# Patient Record
Sex: Female | Born: 1970 | Race: Black or African American | Hispanic: No | Marital: Married | Smoking: Never smoker
Health system: Southern US, Community
[De-identification: ages and names within clinical notes are randomized; demographics above are authoritative.]

## PROBLEM LIST (undated history)

## (undated) ENCOUNTER — Ambulatory Visit (HOSPITAL_COMMUNITY): Discharge status: Absent without leave | Payer: BC Managed Care – PPO

## (undated) DIAGNOSIS — D219 Benign neoplasm of connective and other soft tissue, unspecified: Secondary | ICD-10-CM

## (undated) DIAGNOSIS — N9089 Other specified noninflammatory disorders of vulva and perineum: Secondary | ICD-10-CM

## (undated) HISTORY — PX: OTHER SURGICAL HISTORY: SHX169

---
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
## 1999-08-28 ENCOUNTER — Encounter: Payer: Self-pay | Admitting: *Deleted

## 1999-08-28 ENCOUNTER — Encounter: Admission: RE | Admit: 1999-08-28 | Discharge: 1999-08-28 | Payer: Self-pay | Admitting: *Deleted

## 2001-06-09 ENCOUNTER — Encounter: Payer: Self-pay | Admitting: Surgery

## 2001-06-09 ENCOUNTER — Encounter: Admission: RE | Admit: 2001-06-09 | Discharge: 2001-06-09 | Payer: Self-pay | Admitting: Surgery

## 2007-11-06 DIAGNOSIS — D219 Benign neoplasm of connective and other soft tissue, unspecified: Secondary | ICD-10-CM

## 2007-11-06 HISTORY — DX: Benign neoplasm of connective and other soft tissue, unspecified: D21.9

## 2010-11-26 ENCOUNTER — Encounter: Payer: Self-pay | Admitting: Obstetrics & Gynecology

## 2013-01-02 ENCOUNTER — Other Ambulatory Visit: Payer: Self-pay | Admitting: Obstetrics & Gynecology

## 2013-01-02 DIAGNOSIS — R928 Other abnormal and inconclusive findings on diagnostic imaging of breast: Secondary | ICD-10-CM

## 2013-01-05 ENCOUNTER — Other Ambulatory Visit: Payer: Self-pay | Admitting: Obstetrics & Gynecology

## 2013-01-05 DIAGNOSIS — D259 Leiomyoma of uterus, unspecified: Secondary | ICD-10-CM

## 2013-01-08 ENCOUNTER — Other Ambulatory Visit: Payer: Self-pay | Admitting: Obstetrics & Gynecology

## 2013-01-08 ENCOUNTER — Ambulatory Visit
Admission: RE | Admit: 2013-01-08 | Discharge: 2013-01-08 | Disposition: A | Discharge status: Home / Self Care | Payer: Self-pay | Source: Ambulatory Visit | Attending: Obstetrics & Gynecology | Admitting: Obstetrics & Gynecology

## 2013-01-08 DIAGNOSIS — D259 Leiomyoma of uterus, unspecified: Secondary | ICD-10-CM

## 2013-01-08 HISTORY — DX: Benign neoplasm of connective and other soft tissue, unspecified: D21.9

## 2013-01-14 ENCOUNTER — Ambulatory Visit
Admission: RE | Admit: 2013-01-14 | Discharge: 2013-01-14 | Disposition: A | Discharge status: Home / Self Care | Payer: BC Managed Care – PPO | Source: Ambulatory Visit | Attending: Obstetrics & Gynecology | Admitting: Obstetrics & Gynecology

## 2013-01-14 DIAGNOSIS — R928 Other abnormal and inconclusive findings on diagnostic imaging of breast: Secondary | ICD-10-CM

## 2013-01-17 ENCOUNTER — Ambulatory Visit
Admission: RE | Admit: 2013-01-17 | Discharge: 2013-01-17 | Disposition: A | Discharge status: Home / Self Care | Payer: BC Managed Care – PPO | Source: Ambulatory Visit | Attending: Obstetrics & Gynecology | Admitting: Obstetrics & Gynecology

## 2013-01-17 DIAGNOSIS — D259 Leiomyoma of uterus, unspecified: Secondary | ICD-10-CM

## 2013-01-17 MED ORDER — GADOBENATE DIMEGLUMINE 529 MG/ML IV SOLN
19.0000 mL | Freq: Once | INTRAVENOUS | Status: AC | PRN
  Administered 2013-01-17: 19 mL via INTRAVENOUS

## 2013-01-20 ENCOUNTER — Telehealth: Payer: Self-pay | Admitting: Emergency Medicine

## 2013-01-20 NOTE — Telephone Encounter (Signed)
LM FOR PT TO LET HER KNOW THAT DR HOSS REVIEWED THE MRI AND WAS GOOD FOR THE [place] PROCEDURE.

## 2013-01-21 ENCOUNTER — Telehealth: Payer: Self-pay | Admitting: Emergency Medicine

## 2013-01-21 NOTE — Telephone Encounter (Signed)
PT CALLED AND WANTS SPECIFIC INFO ABOUT MRI RESULTS, ALSO HAS ADDITIONAL QUESTIONS ABOUT PROCEDURE AND INFO IN THE PAMPHLET.  WILL FORWARD MESSAGE TO DR HOSS AND HAVE HIM TO CONTACT PT, DIRECTLY.  Jari Sportsman, EMT 01/21/2013 3:45 PM

## 2013-01-22 NOTE — Telephone Encounter (Signed)
PT HAD ADDITIONAL QUESTIONS AND WANTED TO KNOW HER SPECIFIC RESULTS OF MRI.    TOLD DR HOSS AND HE WILL CONTACT PT.

## 2013-01-26 ENCOUNTER — Other Ambulatory Visit (HOSPITAL_COMMUNITY): Payer: Self-pay | Admitting: Interventional Radiology

## 2013-01-26 DIAGNOSIS — D219 Benign neoplasm of connective and other soft tissue, unspecified: Secondary | ICD-10-CM

## 2013-02-03 ENCOUNTER — Telehealth: Payer: Self-pay | Admitting: Emergency Medicine

## 2013-02-03 NOTE — Telephone Encounter (Signed)
LMOVM TO MAKE AWARE THAT NO AUTHO IS NEEDED FOR [place] PROCEDURE.

## 2013-02-13 ENCOUNTER — Encounter (HOSPITAL_COMMUNITY): Payer: Self-pay | Admitting: Pharmacy Technician

## 2013-02-19 ENCOUNTER — Other Ambulatory Visit: Payer: Self-pay | Admitting: Radiology

## 2013-02-23 ENCOUNTER — Observation Stay (HOSPITAL_COMMUNITY)
Admission: RE | Admit: 2013-02-23 | Discharge: 2013-02-24 | Disposition: A | Discharge status: Home / Self Care | Payer: BC Managed Care – PPO | Source: Ambulatory Visit | Attending: Interventional Radiology | Admitting: Interventional Radiology

## 2013-02-23 ENCOUNTER — Encounter (HOSPITAL_COMMUNITY): Payer: Self-pay | Admitting: Interventional Radiology

## 2013-02-23 ENCOUNTER — Encounter (HOSPITAL_COMMUNITY): Payer: Self-pay

## 2013-02-23 ENCOUNTER — Ambulatory Visit (HOSPITAL_COMMUNITY)
Admission: RE | Admit: 2013-02-23 | Discharge: 2013-02-23 | Disposition: A | Discharge status: Home / Self Care | Payer: BC Managed Care – PPO | Source: Ambulatory Visit | Attending: Interventional Radiology | Admitting: Interventional Radiology

## 2013-02-23 VITALS — BP 148/89 | HR 85 | Temp 98.2°F | Resp 14 | Ht 69.0 in | Wt 217.4 lb

## 2013-02-23 DIAGNOSIS — D219 Benign neoplasm of connective and other soft tissue, unspecified: Secondary | ICD-10-CM

## 2013-02-23 DIAGNOSIS — M545 Low back pain, unspecified: Secondary | ICD-10-CM | POA: Insufficient documentation

## 2013-02-23 DIAGNOSIS — N939 Abnormal uterine and vaginal bleeding, unspecified: Secondary | ICD-10-CM | POA: Insufficient documentation

## 2013-02-23 DIAGNOSIS — N920 Excessive and frequent menstruation with regular cycle: Secondary | ICD-10-CM | POA: Insufficient documentation

## 2013-02-23 DIAGNOSIS — R110 Nausea: Secondary | ICD-10-CM | POA: Insufficient documentation

## 2013-02-23 DIAGNOSIS — N926 Irregular menstruation, unspecified: Secondary | ICD-10-CM | POA: Insufficient documentation

## 2013-02-23 DIAGNOSIS — N949 Unspecified condition associated with female genital organs and menstrual cycle: Secondary | ICD-10-CM | POA: Insufficient documentation

## 2013-02-23 DIAGNOSIS — D259 Leiomyoma of uterus, unspecified: Principal | ICD-10-CM | POA: Insufficient documentation

## 2013-02-23 HISTORY — PX: UTERINE FIBROID EMBOLIZATION: SHX825

## 2013-02-23 LAB — HCG, SERUM, QUALITATIVE: Preg, Serum: NEGATIVE

## 2013-02-23 LAB — PROTIME-INR: Prothrombin Time: 12.2 seconds (ref 11.6–15.2)

## 2013-02-23 LAB — BASIC METABOLIC PANEL
BUN: 10 mg/dL (ref 6–23)
Calcium: 8.5 mg/dL (ref 8.4–10.5)
Creatinine, Ser: 0.76 mg/dL (ref 0.50–1.10)
GFR calc Af Amer: >90 mL/min (ref >90)
GFR calc non Af Amer: >90 mL/min (ref >90)
Potassium: 3.6 mEq/L (ref 3.5–5.1)

## 2013-02-23 LAB — CBC
MCHC: 34 g/dL (ref 30.0–36.0)
RDW: 13.3 % (ref 11.5–15.5)

## 2013-02-23 MED ORDER — ONDANSETRON HCL 4 MG/2ML IJ SOLN
4.0000 mg | Freq: Four times a day (QID) | INTRAMUSCULAR | Status: DC | PRN
  Administered 2013-02-23 (×2): 4 mg via INTRAVENOUS
  Filled 2013-02-23: qty 2

## 2013-02-23 MED ORDER — KETOROLAC TROMETHAMINE 30 MG/ML IJ SOLN
30.0000 mg | Freq: Once | INTRAMUSCULAR | Status: AC
  Administered 2013-02-23: 30 mg via INTRAVENOUS
  Filled 2013-02-23: qty 1

## 2013-02-23 MED ORDER — CEFAZOLIN SODIUM-DEXTROSE 2-3 GM-% IV SOLR
2.0000 g | Freq: Once | INTRAVENOUS | Status: AC
  Administered 2013-02-23: 2 g via INTRAVENOUS
  Filled 2013-02-23: qty 50

## 2013-02-23 MED ORDER — DIPHENHYDRAMINE HCL 50 MG/ML IJ SOLN: 12.5000 mg | Freq: Four times a day (QID) | INTRAMUSCULAR | Status: DC | PRN

## 2013-02-23 MED ORDER — SODIUM CHLORIDE 0.9 % IV SOLN
Freq: Once | INTRAVENOUS | Status: AC
  Administered 2013-02-23: 08:00:00 via INTRAVENOUS

## 2013-02-23 MED ORDER — SODIUM CHLORIDE 0.9 % IJ SOLN
3.0000 mL | Freq: Two times a day (BID) | INTRAMUSCULAR | Status: DC
  Administered 2013-02-24: 3 mL via INTRAVENOUS

## 2013-02-23 MED ORDER — MIDAZOLAM HCL 2 MG/2ML IJ SOLN
INTRAMUSCULAR | Status: AC | PRN
  Administered 2013-02-23 (×3): 1 mg via INTRAVENOUS
  Administered 2013-02-23 (×2): 0.5 mg via INTRAVENOUS

## 2013-02-23 MED ORDER — MIDAZOLAM HCL 2 MG/2ML IJ SOLN
INTRAMUSCULAR | Status: AC
  Filled 2013-02-23: qty 6

## 2013-02-23 MED ORDER — PROMETHAZINE HCL 25 MG PO TABS
25.0000 mg | ORAL_TABLET | Freq: Three times a day (TID) | ORAL | Status: DC | PRN
  Filled 2013-02-23: qty 1

## 2013-02-23 MED ORDER — HYDROMORPHONE HCL PF 2 MG/ML IJ SOLN
INTRAMUSCULAR | Status: AC
  Filled 2013-02-23: qty 1

## 2013-02-23 MED ORDER — FENTANYL CITRATE 0.05 MG/ML IJ SOLN: 50.0000 ug | Freq: Once | INTRAMUSCULAR | Status: DC

## 2013-02-23 MED ORDER — FENTANYL CITRATE 0.05 MG/ML IJ SOLN
INTRAMUSCULAR | Status: DC
Start: 2013-02-23 — End: 2013-02-24
  Filled 2013-02-23: qty 6

## 2013-02-23 MED ORDER — ONDANSETRON HCL 4 MG/2ML IJ SOLN
4.0000 mg | Freq: Four times a day (QID) | INTRAMUSCULAR | Status: DC | PRN
  Filled 2013-02-23: qty 2

## 2013-02-23 MED ORDER — LIDOCAINE HCL 1 % IJ SOLN
INTRAMUSCULAR | Status: AC
  Filled 2013-02-23: qty 20

## 2013-02-23 MED ORDER — FENTANYL CITRATE 0.05 MG/ML IJ SOLN
INTRAMUSCULAR | Status: AC | PRN
  Administered 2013-02-23: 100 ug via INTRAVENOUS
  Administered 2013-02-23 (×2): 25 ug via INTRAVENOUS
  Administered 2013-02-23: 50 ug via INTRAVENOUS

## 2013-02-23 MED ORDER — HYDROMORPHONE HCL PF 1 MG/ML IJ SOLN
INTRAMUSCULAR | Status: AC | PRN
  Administered 2013-02-23: 1 mg via INTRAVENOUS

## 2013-02-23 MED ORDER — HYDROMORPHONE 0.3 MG/ML IV SOLN
INTRAVENOUS | Status: DC
  Administered 2013-02-23: 4.2 mg via INTRAVENOUS
  Administered 2013-02-23: 5.7 mg via INTRAVENOUS
  Administered 2013-02-23 – 2013-02-24 (×3): via INTRAVENOUS
  Administered 2013-02-24: 2.7 mg via INTRAVENOUS
  Administered 2013-02-24: 3.3 mg via INTRAVENOUS
  Administered 2013-02-24: 1.2 mg via INTRAVENOUS
  Filled 2013-02-23 (×4): qty 25

## 2013-02-23 MED ORDER — DOCUSATE SODIUM 100 MG PO CAPS
100.0000 mg | ORAL_CAPSULE | Freq: Two times a day (BID) | ORAL | Status: DC
  Administered 2013-02-23 – 2013-02-24 (×2): 100 mg via ORAL
  Filled 2013-02-23 (×4): qty 1

## 2013-02-23 MED ORDER — PROMETHAZINE HCL 25 MG RE SUPP: 25.0000 mg | Freq: Three times a day (TID) | RECTAL | Status: DC | PRN

## 2013-02-23 MED ORDER — IOHEXOL 300 MG/ML  SOLN
90.0000 mL | Freq: Once | INTRAMUSCULAR | Status: AC | PRN
  Administered 2013-02-23: 1 mL via INTRA_ARTERIAL

## 2013-02-23 MED ORDER — SODIUM CHLORIDE 0.9 % IJ SOLN: 9.0000 mL | INTRAMUSCULAR | Status: DC | PRN

## 2013-02-23 MED ORDER — DIPHENHYDRAMINE HCL 12.5 MG/5ML PO ELIX
12.5000 mg | ORAL_SOLUTION | Freq: Four times a day (QID) | ORAL | Status: DC | PRN
  Filled 2013-02-23: qty 5

## 2013-02-23 MED ORDER — NALOXONE HCL 0.4 MG/ML IJ SOLN: 0.4000 mg | INTRAMUSCULAR | Status: DC | PRN

## 2013-02-23 MED ORDER — SODIUM CHLORIDE 0.9 % IV SOLN: 250.0000 mL | INTRAVENOUS | Status: DC | PRN

## 2013-02-23 MED ORDER — SODIUM CHLORIDE 0.9 % IJ SOLN: 3.0000 mL | INTRAMUSCULAR | Status: DC | PRN

## 2013-02-23 MED ORDER — HYDROCODONE-ACETAMINOPHEN 5-325 MG PO TABS
1.0000 | ORAL_TABLET | ORAL | Status: DC | PRN
  Administered 2013-02-24: 2 via ORAL
  Filled 2013-02-23 (×2): qty 2

## 2013-02-23 NOTE — H&P (Signed)
Sharon Bell is an 42 y.o. female.   Chief Complaint: heavy uterine bleeding, pelvic cramping, fibroids HPI: Patient with symptomatic uterine fibroids presents today for bilateral uterine artery embolization.  Past Medical History  Diagnosis Date  . Fibroids 2009    Past Surgical History  Procedure Laterality Date  . Bilateral repair of hammertoe      4th toe of both feet  . Polyknital   1990    polyknital cyst excision     History reviewed. No pertinent family history. Social History:  reports that she has never smoked. She has never used smokeless tobacco. She reports that  drinks alcohol. She reports that she does not use illicit drugs.  Allergies: No Known Allergies  Current outpatient prescriptions:norethindrone-ethinyl estradiol 1/35 (ORTHO-NOVUM 1/35, 28,) tablet, Take 1 tablet by mouth daily., Disp: , Rfl: ;  Multiple Vitamin (MULTIVITAMIN) tablet, Take 1 tablet by mouth daily., Disp: , Rfl: ;  naproxen sodium (ANAPROX) 220 MG tablet, Take 220 mg by mouth 2 (two) times daily with a meal., Disp: , Rfl:  Current facility-administered medications:ceFAZolin (ANCEF) IVPB 2 g/50 mL premix, 2 g, Intravenous, Once, Robet Leu, PA-C  Results for orders placed during the hospital encounter of 02/23/13  CBC      Result Value Range   WBC 6.4  4.0 - 10.5 K/uL   RBC 4.16  3.87 - 5.11 MIL/uL   Hemoglobin 12.4  12.0 - 15.0 g/dL   HCT 45.4  09.8 - 11.9 %   MCV 87.7  78.0 - 100.0 fL   MCH 29.8  26.0 - 34.0 pg   MCHC 34.0  30.0 - 36.0 g/dL   RDW 14.7  82.9 - 56.2 %   Platelets 318  150 - 400 K/uL   BMP/PT/INR PENDING Review of Systems  Constitutional: Negative for fever and chills.  Respiratory: Negative for cough and shortness of breath.   Cardiovascular: Negative for chest pain.  Gastrointestinal: Positive for abdominal pain. Negative for nausea and vomiting.       Occ constipation  Genitourinary: Negative for dysuria and hematuria.       Heavy uterine  bleeding, pelvic cramping  Musculoskeletal:       Occ low back pain  Neurological: Negative for headaches.   Vitals: BP 129/87  HR 87  R 18  O2 SATS 99%RA  TEMP 97.4 Last menstrual period 02/10/2013. Physical Exam  Constitutional: She is oriented to person, place, and time. She appears well-developed and well-nourished.  Cardiovascular: Normal rate and regular rhythm.   Respiratory: Effort normal and breath sounds normal.  GI: Soft. Bowel sounds are normal. There is tenderness.  Fibroid uterus  Musculoskeletal: Normal range of motion. She exhibits no edema.  Neurological: She is alert and oriented to person, place, and time.     Assessment/Plan Patient with symptomatic uterine fibroids. Plan is for bilateral uterine artery embolization today. Details/risks of procedure d/w pt/husband with their understanding and consent. Pt will be observed overnight following procedure for pain control.  Chivas Notz,D KEVIN 02/23/2013, 8:31 AM

## 2013-02-23 NOTE — Progress Notes (Signed)
Subjective: Pt c/o intermittent nausea, pelvic cramping  Objective: Vital signs in last 24 hours: Temp:  [97.4 F (36.3 C)-98.1 F (36.7 C)] 97.5 F (36.4 C) (04/21 1511) Pulse Rate:  [66-87] 68 (04/21 1511) Resp:  [9-22] 12 (04/21 1511) BP: (111-171)/(10-104) 157/91 mmHg (04/21 1511) SpO2:  [95 %-100 %] 99 % (04/21 1511) Weight:  [201 lb (91.173 kg)-217 lb 6 oz (98.6 kg)] 217 lb 6 oz (98.6 kg) (04/21 1140) Last BM Date: 02/23/13  Intake/Output from previous day:   Intake/Output this shift: Total I/O In: 282 [I.V.:282] Out: 575 [Urine:575]  Rt groin puncture site clean and dry, NT, no hematoma; distal pulses intact;  abd- soft,+BS, mild- mod tender pelvic region, fibroid uterus  Lab Results:   Recent Labs  02/23/13 0815  WBC 6.4  HGB 12.4  HCT 36.5  PLT 318   BMET  Recent Labs  02/23/13 0815  NA 137  K 3.6  CL 106  CO2 25  GLUCOSE 110*  BUN 10  CREATININE 0.76  CALCIUM 8.5   PT/INR  Recent Labs  02/23/13 0815  LABPROT 12.2  INR 0.91   ABG No results found for this basename: PHART, PCO2, PO2, HCO3,  in the last 72 hours  Studies/Results: Ir Angiogram Pelvis Selective Or Supraselective  02/23/2013  *RADIOLOGY REPORT*  Clinical Data/Indication: MENORRHAGIA.  PELVIC PAIN.  UTERINE FIBROIDS.  UTERINE ARTERY EMBOLIZATION  Sedation: Versed 4.0mg , Fentanyl 200 mg.  Total Moderate Sedation Time: 105 minutes.  Contrast Volume: 130 ml Omnipaque-300.  Additional Medications: 30 mg Toradol.  As antibiotic prophylaxis, Ancef  was ordered pre-procedure and administered intravenously within one hour of incision.  Fluoroscopy Time: 24.4 minutes.  Vessels selected:  Internal iliac artery bilateral third order.  Procedure: The procedure, risks, benefits, and alternatives were explained to the patient. Questions regarding the procedure were encouraged and answered. The patient understands and consents to the procedure.  The right groin was prepped with betadine in a  sterile fashion, and a sterile drape was applied covering the operative field. A sterile gown and sterile gloves were used for the procedure.  Under sonographic guidance, a micropuncture needle was inserted into the right common femoral artery and upsized to a [place].  A 5- French sheath was inserted.  Cobra II catheter was advanced into the aorta.  The contralateral left common iliac artery was selected.  The internal iliac artery on the left was selected. 3-D angiography was performed.  A micro catheter was advanced over an 018 glide wire into the left uterine artery.  Angiography was performed.  Embolization was performed utilizing two vials 500-700 micron embospheres particles and four vials 700-900 micron embospheres.  The micro catheter was removed and flushed.  A Waltman's loop was created.  The ipsilateral right common iliac artery and right internal iliac artery were selected. 3-D angiography was performed. A micro catheter was advanced over a 0018 glide wire into the right uterine artery.  Angiography was performed.  Embolization was again performed with two vials 500-700 micron embospheres and one bile 700-900 micron embospheres.  The micro catheter and Cobra catheter were removed.  Right femoral angiography was performed. An Exoseal device was deployed without complication and hemostasis was achieved.  Findings: Imaging demonstrates bilateral pelvic anatomy and bilateral uterine artery angiography.  Expected anatomy is identified.  Post embolization angiography demonstrates relative stasis of flow and pruning of the vasculature to the uterus.  Complications: None.  IMPRESSION: Successful bilateral uterine artery embolization.   Original Report Authenticated By:  Jolaine Click, M.D.    Ir Angiogram Pelvis Selective Or Supraselective  02/23/2013  *RADIOLOGY REPORT*  Clinical Data/Indication: MENORRHAGIA.  PELVIC PAIN.  UTERINE FIBROIDS.  UTERINE ARTERY EMBOLIZATION  Sedation: Versed 4.0mg , Fentanyl 200 mg.   Total Moderate Sedation Time: 105 minutes.  Contrast Volume: 130 ml Omnipaque-300.  Additional Medications: 30 mg Toradol.  As antibiotic prophylaxis, Ancef  was ordered pre-procedure and administered intravenously within one hour of incision.  Fluoroscopy Time: 24.4 minutes.  Vessels selected:  Internal iliac artery bilateral third order.  Procedure: The procedure, risks, benefits, and alternatives were explained to the patient. Questions regarding the procedure were encouraged and answered. The patient understands and consents to the procedure.  The right groin was prepped with betadine in a sterile fashion, and a sterile drape was applied covering the operative field. A sterile gown and sterile gloves were used for the procedure.  Under sonographic guidance, a micropuncture needle was inserted into the right common femoral artery and upsized to a [place].  A 5- French sheath was inserted.  Cobra II catheter was advanced into the aorta.  The contralateral left common iliac artery was selected.  The internal iliac artery on the left was selected. 3-D angiography was performed.  A micro catheter was advanced over an 018 glide wire into the left uterine artery.  Angiography was performed.  Embolization was performed utilizing two vials 500-700 micron embospheres particles and four vials 700-900 micron embospheres.  The micro catheter was removed and flushed.  A Waltman's loop was created.  The ipsilateral right common iliac artery and right internal iliac artery were selected. 3-D angiography was performed. A micro catheter was advanced over a 0018 glide wire into the right uterine artery.  Angiography was performed.  Embolization was again performed with two vials 500-700 micron embospheres and one bile 700-900 micron embospheres.  The micro catheter and Cobra catheter were removed.  Right femoral angiography was performed. An Exoseal device was deployed without complication and hemostasis was achieved.  Findings:  Imaging demonstrates bilateral pelvic anatomy and bilateral uterine artery angiography.  Expected anatomy is identified.  Post embolization angiography demonstrates relative stasis of flow and pruning of the vasculature to the uterus.  Complications: None.  IMPRESSION: Successful bilateral uterine artery embolization.   Original Report Authenticated By: Jolaine Click, M.D.    Ir Angiogram Selective Each Additional Vessel  02/23/2013  *RADIOLOGY REPORT*  Clinical Data/Indication: MENORRHAGIA.  PELVIC PAIN.  UTERINE FIBROIDS.  UTERINE ARTERY EMBOLIZATION  Sedation: Versed 4.0mg , Fentanyl 200 mg.  Total Moderate Sedation Time: 105 minutes.  Contrast Volume: 130 ml Omnipaque-300.  Additional Medications: 30 mg Toradol.  As antibiotic prophylaxis, Ancef  was ordered pre-procedure and administered intravenously within one hour of incision.  Fluoroscopy Time: 24.4 minutes.  Vessels selected:  Internal iliac artery bilateral third order.  Procedure: The procedure, risks, benefits, and alternatives were explained to the patient. Questions regarding the procedure were encouraged and answered. The patient understands and consents to the procedure.  The right groin was prepped with betadine in a sterile fashion, and a sterile drape was applied covering the operative field. A sterile gown and sterile gloves were used for the procedure.  Under sonographic guidance, a micropuncture needle was inserted into the right common femoral artery and upsized to a [place].  A 5- French sheath was inserted.  Cobra II catheter was advanced into the aorta.  The contralateral left common iliac artery was selected.  The internal iliac artery on the left was selected. 3-D  angiography was performed.  A micro catheter was advanced over an 018 glide wire into the left uterine artery.  Angiography was performed.  Embolization was performed utilizing two vials 500-700 micron embospheres particles and four vials 700-900 micron embospheres.  The micro  catheter was removed and flushed.  A Waltman's loop was created.  The ipsilateral right common iliac artery and right internal iliac artery were selected. 3-D angiography was performed. A micro catheter was advanced over a 0018 glide wire into the right uterine artery.  Angiography was performed.  Embolization was again performed with two vials 500-700 micron embospheres and one bile 700-900 micron embospheres.  The micro catheter and Cobra catheter were removed.  Right femoral angiography was performed. An Exoseal device was deployed without complication and hemostasis was achieved.  Findings: Imaging demonstrates bilateral pelvic anatomy and bilateral uterine artery angiography.  Expected anatomy is identified.  Post embolization angiography demonstrates relative stasis of flow and pruning of the vasculature to the uterus.  Complications: None.  IMPRESSION: Successful bilateral uterine artery embolization.   Original Report Authenticated By: Jolaine Click, M.D.    Ir Angiogram Selective Each Additional Vessel  02/23/2013  *RADIOLOGY REPORT*  Clinical Data/Indication: MENORRHAGIA.  PELVIC PAIN.  UTERINE FIBROIDS.  UTERINE ARTERY EMBOLIZATION  Sedation: Versed 4.0mg , Fentanyl 200 mg.  Total Moderate Sedation Time: 105 minutes.  Contrast Volume: 130 ml Omnipaque-300.  Additional Medications: 30 mg Toradol.  As antibiotic prophylaxis, Ancef  was ordered pre-procedure and administered intravenously within one hour of incision.  Fluoroscopy Time: 24.4 minutes.  Vessels selected:  Internal iliac artery bilateral third order.  Procedure: The procedure, risks, benefits, and alternatives were explained to the patient. Questions regarding the procedure were encouraged and answered. The patient understands and consents to the procedure.  The right groin was prepped with betadine in a sterile fashion, and a sterile drape was applied covering the operative field. A sterile gown and sterile gloves were used for the procedure.   Under sonographic guidance, a micropuncture needle was inserted into the right common femoral artery and upsized to a [place].  A 5- French sheath was inserted.  Cobra II catheter was advanced into the aorta.  The contralateral left common iliac artery was selected.  The internal iliac artery on the left was selected. 3-D angiography was performed.  A micro catheter was advanced over an 018 glide wire into the left uterine artery.  Angiography was performed.  Embolization was performed utilizing two vials 500-700 micron embospheres particles and four vials 700-900 micron embospheres.  The micro catheter was removed and flushed.  A Waltman's loop was created.  The ipsilateral right common iliac artery and right internal iliac artery were selected. 3-D angiography was performed. A micro catheter was advanced over a 0018 glide wire into the right uterine artery.  Angiography was performed.  Embolization was again performed with two vials 500-700 micron embospheres and one bile 700-900 micron embospheres.  The micro catheter and Cobra catheter were removed.  Right femoral angiography was performed. An Exoseal device was deployed without complication and hemostasis was achieved.  Findings: Imaging demonstrates bilateral pelvic anatomy and bilateral uterine artery angiography.  Expected anatomy is identified.  Post embolization angiography demonstrates relative stasis of flow and pruning of the vasculature to the uterus.  Complications: None.  IMPRESSION: Successful bilateral uterine artery embolization.   Original Report Authenticated By: Jolaine Click, M.D.    Ir 3d Andy Gauss  02/23/2013  *RADIOLOGY REPORT*  Clinical Data/Indication: MENORRHAGIA.  PELVIC PAIN.  UTERINE FIBROIDS.  UTERINE ARTERY EMBOLIZATION  Sedation: Versed 4.0mg , Fentanyl 200 mg.  Total Moderate Sedation Time: 105 minutes.  Contrast Volume: 130 ml Omnipaque-300.  Additional Medications: 30 mg Toradol.  As antibiotic prophylaxis, Ancef  was  ordered pre-procedure and administered intravenously within one hour of incision.  Fluoroscopy Time: 24.4 minutes.  Vessels selected:  Internal iliac artery bilateral third order.  Procedure: The procedure, risks, benefits, and alternatives were explained to the patient. Questions regarding the procedure were encouraged and answered. The patient understands and consents to the procedure.  The right groin was prepped with betadine in a sterile fashion, and a sterile drape was applied covering the operative field. A sterile gown and sterile gloves were used for the procedure.  Under sonographic guidance, a micropuncture needle was inserted into the right common femoral artery and upsized to a [place].  A 5- French sheath was inserted.  Cobra II catheter was advanced into the aorta.  The contralateral left common iliac artery was selected.  The internal iliac artery on the left was selected. 3-D angiography was performed.  A micro catheter was advanced over an 018 glide wire into the left uterine artery.  Angiography was performed.  Embolization was performed utilizing two vials 500-700 micron embospheres particles and four vials 700-900 micron embospheres.  The micro catheter was removed and flushed.  A Waltman's loop was created.  The ipsilateral right common iliac artery and right internal iliac artery were selected. 3-D angiography was performed. A micro catheter was advanced over a 0018 glide wire into the right uterine artery.  Angiography was performed.  Embolization was again performed with two vials 500-700 micron embospheres and one bile 700-900 micron embospheres.  The micro catheter and Cobra catheter were removed.  Right femoral angiography was performed. An Exoseal device was deployed without complication and hemostasis was achieved.  Findings: Imaging demonstrates bilateral pelvic anatomy and bilateral uterine artery angiography.  Expected anatomy is identified.  Post embolization angiography demonstrates  relative stasis of flow and pruning of the vasculature to the uterus.  Complications: None.  IMPRESSION: Successful bilateral uterine artery embolization.   Original Report Authenticated By: Jolaine Click, M.D.    Ir US Guide Vasc Access Right  02/23/2013  *RADIOLOGY REPORT*  Clinical Data/Indication: MENORRHAGIA.  PELVIC PAIN.  UTERINE FIBROIDS.  UTERINE ARTERY EMBOLIZATION  Sedation: Versed 4.0mg , Fentanyl 200 mg.  Total Moderate Sedation Time: 105 minutes.  Contrast Volume: 130 ml Omnipaque-300.  Additional Medications: 30 mg Toradol.  As antibiotic prophylaxis, Ancef  was ordered pre-procedure and administered intravenously within one hour of incision.  Fluoroscopy Time: 24.4 minutes.  Vessels selected:  Internal iliac artery bilateral third order.  Procedure: The procedure, risks, benefits, and alternatives were explained to the patient. Questions regarding the procedure were encouraged and answered. The patient understands and consents to the procedure.  The right groin was prepped with betadine in a sterile fashion, and a sterile drape was applied covering the operative field. A sterile gown and sterile gloves were used for the procedure.  Under sonographic guidance, a micropuncture needle was inserted into the right common femoral artery and upsized to a [place].  A 5- French sheath was inserted.  Cobra II catheter was advanced into the aorta.  The contralateral left common iliac artery was selected.  The internal iliac artery on the left was selected. 3-D angiography was performed.  A micro catheter was advanced over an 018 glide wire into the left uterine artery.  Angiography was performed.  Embolization  was performed utilizing two vials 500-700 micron embospheres particles and four vials 700-900 micron embospheres.  The micro catheter was removed and flushed.  A Waltman's loop was created.  The ipsilateral right common iliac artery and right internal iliac artery were selected. 3-D angiography was  performed. A micro catheter was advanced over a 0018 glide wire into the right uterine artery.  Angiography was performed.  Embolization was again performed with two vials 500-700 micron embospheres and one bile 700-900 micron embospheres.  The micro catheter and Cobra catheter were removed.  Right femoral angiography was performed. An Exoseal device was deployed without complication and hemostasis was achieved.  Findings: Imaging demonstrates bilateral pelvic anatomy and bilateral uterine artery angiography.  Expected anatomy is identified.  Post embolization angiography demonstrates relative stasis of flow and pruning of the vasculature to the uterus.  Complications: None.  IMPRESSION: Successful bilateral uterine artery embolization.   Original Report Authenticated By: Jolaine Click, M.D.    Ir Embo Tumor Organ Ischemia Infarct Inc Guide [employer]  02/23/2013  *RADIOLOGY REPORT*  Clinical Data/Indication: MENORRHAGIA.  PELVIC PAIN.  UTERINE FIBROIDS.  UTERINE ARTERY EMBOLIZATION  Sedation: Versed 4.0mg , Fentanyl 200 mg.  Total Moderate Sedation Time: 105 minutes.  Contrast Volume: 130 ml Omnipaque-300.  Additional Medications: 30 mg Toradol.  As antibiotic prophylaxis, Ancef  was ordered pre-procedure and administered intravenously within one hour of incision.  Fluoroscopy Time: 24.4 minutes.  Vessels selected:  Internal iliac artery bilateral third order.  Procedure: The procedure, risks, benefits, and alternatives were explained to the patient. Questions regarding the procedure were encouraged and answered. The patient understands and consents to the procedure.  The right groin was prepped with betadine in a sterile fashion, and a sterile drape was applied covering the operative field. A sterile gown and sterile gloves were used for the procedure.  Under sonographic guidance, a micropuncture needle was inserted into the right common femoral artery and upsized to a [place].  A 5- French sheath was inserted.   Cobra II catheter was advanced into the aorta.  The contralateral left common iliac artery was selected.  The internal iliac artery on the left was selected. 3-D angiography was performed.  A micro catheter was advanced over an 018 glide wire into the left uterine artery.  Angiography was performed.  Embolization was performed utilizing two vials 500-700 micron embospheres particles and four vials 700-900 micron embospheres.  The micro catheter was removed and flushed.  A Waltman's loop was created.  The ipsilateral right common iliac artery and right internal iliac artery were selected. 3-D angiography was performed. A micro catheter was advanced over a 0018 glide wire into the right uterine artery.  Angiography was performed.  Embolization was again performed with two vials 500-700 micron embospheres and one bile 700-900 micron embospheres.  The micro catheter and Cobra catheter were removed.  Right femoral angiography was performed. An Exoseal device was deployed without complication and hemostasis was achieved.  Findings: Imaging demonstrates bilateral pelvic anatomy and bilateral uterine artery angiography.  Expected anatomy is identified.  Post embolization angiography demonstrates relative stasis of flow and pruning of the vasculature to the uterus.  Complications: None.  IMPRESSION: Successful bilateral uterine artery embolization.   Original Report Authenticated By: Jolaine Click, M.D.     Anti-infectives: Anti-infectives   Start     Dose/Rate Route Frequency Ordered Stop   02/23/13 0745  ceFAZolin (ANCEF) IVPB 2 g/50 mL premix     2 g 100 mL/hr over 30 Minutes Intravenous  Once 02/23/13 0743 02/23/13 0958      Assessment/Plan: S/p bilateral uterine artery embolization 4/21 secondary to symptomatic uterine fibroids; zofran prn for nausea, dilaudid PCA for pain- if nausea persists consider d/c dilaudid; hydration. For overnight obs. F/u clinic visit with Dr. Bonnielee Haff in 2 weeks.  LOS: 0 days     ALLRED,D [facility] 02/23/2013

## 2013-02-23 NOTE — ED Notes (Signed)
5Fr sheath removed from R fem artery by Dr. Bonnielee Haff.  Hemostasis achieved using Exoseal device.  R groin level 0, 2+RDP.

## 2013-02-23 NOTE — Progress Notes (Signed)
Pt here for her preprocedure for UFE. Pt tearful and states she was told she would get some sedation before foley cath is inserted. Notified kevin Allred PA of this and also Avon Gully in IR.

## 2013-02-23 NOTE — Procedures (Signed)
B [place] R groin access No comp

## 2013-02-24 ENCOUNTER — Other Ambulatory Visit: Payer: Self-pay | Admitting: Radiology

## 2013-02-24 DIAGNOSIS — D219 Benign neoplasm of connective and other soft tissue, unspecified: Secondary | ICD-10-CM

## 2013-02-24 MED ORDER — IBUPROFEN 600 MG PO TABS: 600.0000 mg | ORAL_TABLET | Freq: Three times a day (TID) | ORAL | Status: DC | PRN

## 2013-02-24 MED ORDER — DSS 100 MG PO CAPS: 100.0000 mg | ORAL_CAPSULE | Freq: Two times a day (BID) | ORAL | Status: DC

## 2013-02-24 MED ORDER — PROMETHAZINE HCL 12.5 MG PO TABS: 12.5000 mg | ORAL_TABLET | Freq: Four times a day (QID) | ORAL | Status: DC | PRN

## 2013-02-24 MED ORDER — HYDROCODONE-ACETAMINOPHEN 5-325 MG PO TABS: 1.0000 | ORAL_TABLET | ORAL | Status: DC | PRN

## 2013-02-24 NOTE — Progress Notes (Signed)
Subjective: Pt still nauseated but not as much pain this am. Hasn't been OOB much at all. Hasn't eaten much due to nausea. Denies groin pain  Objective: Physical Exam: BP 128/85  Pulse 72  Temp(Src) 98.5 F (36.9 C) (Oral)  Resp 14  Ht 5\' 9"  (1.753 m)  Wt 217 lb 6 oz (98.6 kg)  BMI 32.09 kg/m2  SpO2 100%  LMP 02/10/2013 Lungs: CTA Heart: Reg Abdomen: soft, ND, NT Ext: rt groin soft, NT, no hematoma   Labs: CBC  Recent Labs  02/23/13 0815  WBC 6.4  HGB 12.4  HCT 36.5  PLT 318   BMET  Recent Labs  02/23/13 0815  NA 137  K 3.6  CL 106  CO2 25  GLUCOSE 110*  BUN 10  CREATININE 0.76  CALCIUM 8.5   LFT No results found for this basename: PROT, ALBUMIN, AST, ALT, ALKPHOS, BILITOT, BILIDIR, IBILI, LIPASE,  in the last 72 hours PT/INR  Recent Labs  02/23/13 0815  LABPROT 12.2  INR 0.91     Studies/Results: Ir Angiogram Pelvis Selective Or Supraselective  02/23/2013  *RADIOLOGY REPORT*  Clinical Data/Indication: MENORRHAGIA.  PELVIC PAIN.  UTERINE FIBROIDS.  UTERINE ARTERY EMBOLIZATION  Sedation: Versed 4.0mg , Fentanyl 200 mg.  Total Moderate Sedation Time: 105 minutes.  Contrast Volume: 130 ml Omnipaque-300.  Additional Medications: 30 mg Toradol.  As antibiotic prophylaxis, Ancef  was ordered pre-procedure and administered intravenously within one hour of incision.  Fluoroscopy Time: 24.4 minutes.  Vessels selected:  Internal iliac artery bilateral third order.  Procedure: The procedure, risks, benefits, and alternatives were explained to the patient. Questions regarding the procedure were encouraged and answered. The patient understands and consents to the procedure.  The right groin was prepped with betadine in a sterile fashion, and a sterile drape was applied covering the operative field. A sterile gown and sterile gloves were used for the procedure.  Under sonographic guidance, a micropuncture needle was inserted into the right common femoral artery and  upsized to a [place].  A 5- French sheath was inserted.  Cobra II catheter was advanced into the aorta.  The contralateral left common iliac artery was selected.  The internal iliac artery on the left was selected. 3-D angiography was performed.  A micro catheter was advanced over an 018 glide wire into the left uterine artery.  Angiography was performed.  Embolization was performed utilizing two vials 500-700 micron embospheres particles and four vials 700-900 micron embospheres.  The micro catheter was removed and flushed.  A Waltman's loop was created.  The ipsilateral right common iliac artery and right internal iliac artery were selected. 3-D angiography was performed. A micro catheter was advanced over a 0018 glide wire into the right uterine artery.  Angiography was performed.  Embolization was again performed with two vials 500-700 micron embospheres and one bile 700-900 micron embospheres.  The micro catheter and Cobra catheter were removed.  Right femoral angiography was performed. An Exoseal device was deployed without complication and hemostasis was achieved.  Findings: Imaging demonstrates bilateral pelvic anatomy and bilateral uterine artery angiography.  Expected anatomy is identified.  Post embolization angiography demonstrates relative stasis of flow and pruning of the vasculature to the uterus.  Complications: None.  IMPRESSION: Successful bilateral uterine artery embolization.   Original Report Authenticated By: Jolaine Click, M.D.    Ir Angiogram Pelvis Selective Or Supraselective  02/23/2013  *RADIOLOGY REPORT*  Clinical Data/Indication: MENORRHAGIA.  PELVIC PAIN.  UTERINE FIBROIDS.  UTERINE ARTERY EMBOLIZATION  Sedation:  Versed 4.0mg , Fentanyl 200 mg.  Total Moderate Sedation Time: 105 minutes.  Contrast Volume: 130 ml Omnipaque-300.  Additional Medications: 30 mg Toradol.  As antibiotic prophylaxis, Ancef  was ordered pre-procedure and administered intravenously within one hour of incision.   Fluoroscopy Time: 24.4 minutes.  Vessels selected:  Internal iliac artery bilateral third order.  Procedure: The procedure, risks, benefits, and alternatives were explained to the patient. Questions regarding the procedure were encouraged and answered. The patient understands and consents to the procedure.  The right groin was prepped with betadine in a sterile fashion, and a sterile drape was applied covering the operative field. A sterile gown and sterile gloves were used for the procedure.  Under sonographic guidance, a micropuncture needle was inserted into the right common femoral artery and upsized to a [place].  A 5- French sheath was inserted.  Cobra II catheter was advanced into the aorta.  The contralateral left common iliac artery was selected.  The internal iliac artery on the left was selected. 3-D angiography was performed.  A micro catheter was advanced over an 018 glide wire into the left uterine artery.  Angiography was performed.  Embolization was performed utilizing two vials 500-700 micron embospheres particles and four vials 700-900 micron embospheres.  The micro catheter was removed and flushed.  A Waltman's loop was created.  The ipsilateral right common iliac artery and right internal iliac artery were selected. 3-D angiography was performed. A micro catheter was advanced over a 0018 glide wire into the right uterine artery.  Angiography was performed.  Embolization was again performed with two vials 500-700 micron embospheres and one bile 700-900 micron embospheres.  The micro catheter and Cobra catheter were removed.  Right femoral angiography was performed. An Exoseal device was deployed without complication and hemostasis was achieved.  Findings: Imaging demonstrates bilateral pelvic anatomy and bilateral uterine artery angiography.  Expected anatomy is identified.  Post embolization angiography demonstrates relative stasis of flow and pruning of the vasculature to the uterus.   Complications: None.  IMPRESSION: Successful bilateral uterine artery embolization.   Original Report Authenticated By: Jolaine Click, M.D.    Ir Angiogram Selective Each Additional Vessel  02/23/2013  *RADIOLOGY REPORT*  Clinical Data/Indication: MENORRHAGIA.  PELVIC PAIN.  UTERINE FIBROIDS.  UTERINE ARTERY EMBOLIZATION  Sedation: Versed 4.0mg , Fentanyl 200 mg.  Total Moderate Sedation Time: 105 minutes.  Contrast Volume: 130 ml Omnipaque-300.  Additional Medications: 30 mg Toradol.  As antibiotic prophylaxis, Ancef  was ordered pre-procedure and administered intravenously within one hour of incision.  Fluoroscopy Time: 24.4 minutes.  Vessels selected:  Internal iliac artery bilateral third order.  Procedure: The procedure, risks, benefits, and alternatives were explained to the patient. Questions regarding the procedure were encouraged and answered. The patient understands and consents to the procedure.  The right groin was prepped with betadine in a sterile fashion, and a sterile drape was applied covering the operative field. A sterile gown and sterile gloves were used for the procedure.  Under sonographic guidance, a micropuncture needle was inserted into the right common femoral artery and upsized to a [place].  A 5- French sheath was inserted.  Cobra II catheter was advanced into the aorta.  The contralateral left common iliac artery was selected.  The internal iliac artery on the left was selected. 3-D angiography was performed.  A micro catheter was advanced over an 018 glide wire into the left uterine artery.  Angiography was performed.  Embolization was performed utilizing two vials 500-700 micron embospheres  particles and four vials 700-900 micron embospheres.  The micro catheter was removed and flushed.  A Waltman's loop was created.  The ipsilateral right common iliac artery and right internal iliac artery were selected. 3-D angiography was performed. A micro catheter was advanced over a 0018 glide  wire into the right uterine artery.  Angiography was performed.  Embolization was again performed with two vials 500-700 micron embospheres and one bile 700-900 micron embospheres.  The micro catheter and Cobra catheter were removed.  Right femoral angiography was performed. An Exoseal device was deployed without complication and hemostasis was achieved.  Findings: Imaging demonstrates bilateral pelvic anatomy and bilateral uterine artery angiography.  Expected anatomy is identified.  Post embolization angiography demonstrates relative stasis of flow and pruning of the vasculature to the uterus.  Complications: None.  IMPRESSION: Successful bilateral uterine artery embolization.   Original Report Authenticated By: Jolaine Click, M.D.    Ir Angiogram Selective Each Additional Vessel  02/23/2013  *RADIOLOGY REPORT*  Clinical Data/Indication: MENORRHAGIA.  PELVIC PAIN.  UTERINE FIBROIDS.  UTERINE ARTERY EMBOLIZATION  Sedation: Versed 4.0mg , Fentanyl 200 mg.  Total Moderate Sedation Time: 105 minutes.  Contrast Volume: 130 ml Omnipaque-300.  Additional Medications: 30 mg Toradol.  As antibiotic prophylaxis, Ancef  was ordered pre-procedure and administered intravenously within one hour of incision.  Fluoroscopy Time: 24.4 minutes.  Vessels selected:  Internal iliac artery bilateral third order.  Procedure: The procedure, risks, benefits, and alternatives were explained to the patient. Questions regarding the procedure were encouraged and answered. The patient understands and consents to the procedure.  The right groin was prepped with betadine in a sterile fashion, and a sterile drape was applied covering the operative field. A sterile gown and sterile gloves were used for the procedure.  Under sonographic guidance, a micropuncture needle was inserted into the right common femoral artery and upsized to a [place].  A 5- French sheath was inserted.  Cobra II catheter was advanced into the aorta.  The contralateral left  common iliac artery was selected.  The internal iliac artery on the left was selected. 3-D angiography was performed.  A micro catheter was advanced over an 018 glide wire into the left uterine artery.  Angiography was performed.  Embolization was performed utilizing two vials 500-700 micron embospheres particles and four vials 700-900 micron embospheres.  The micro catheter was removed and flushed.  A Waltman's loop was created.  The ipsilateral right common iliac artery and right internal iliac artery were selected. 3-D angiography was performed. A micro catheter was advanced over a 0018 glide wire into the right uterine artery.  Angiography was performed.  Embolization was again performed with two vials 500-700 micron embospheres and one bile 700-900 micron embospheres.  The micro catheter and Cobra catheter were removed.  Right femoral angiography was performed. An Exoseal device was deployed without complication and hemostasis was achieved.  Findings: Imaging demonstrates bilateral pelvic anatomy and bilateral uterine artery angiography.  Expected anatomy is identified.  Post embolization angiography demonstrates relative stasis of flow and pruning of the vasculature to the uterus.  Complications: None.  IMPRESSION: Successful bilateral uterine artery embolization.   Original Report Authenticated By: Jolaine Click, M.D.    Ir 3d Andy Gauss  02/23/2013  *RADIOLOGY REPORT*  Clinical Data/Indication: MENORRHAGIA.  PELVIC PAIN.  UTERINE FIBROIDS.  UTERINE ARTERY EMBOLIZATION  Sedation: Versed 4.0mg , Fentanyl 200 mg.  Total Moderate Sedation Time: 105 minutes.  Contrast Volume: 130 ml Omnipaque-300.  Additional Medications: 30 mg Toradol.  As antibiotic prophylaxis, Ancef  was ordered pre-procedure and administered intravenously within one hour of incision.  Fluoroscopy Time: 24.4 minutes.  Vessels selected:  Internal iliac artery bilateral third order.  Procedure: The procedure, risks, benefits, and  alternatives were explained to the patient. Questions regarding the procedure were encouraged and answered. The patient understands and consents to the procedure.  The right groin was prepped with betadine in a sterile fashion, and a sterile drape was applied covering the operative field. A sterile gown and sterile gloves were used for the procedure.  Under sonographic guidance, a micropuncture needle was inserted into the right common femoral artery and upsized to a [place].  A 5- French sheath was inserted.  Cobra II catheter was advanced into the aorta.  The contralateral left common iliac artery was selected.  The internal iliac artery on the left was selected. 3-D angiography was performed.  A micro catheter was advanced over an 018 glide wire into the left uterine artery.  Angiography was performed.  Embolization was performed utilizing two vials 500-700 micron embospheres particles and four vials 700-900 micron embospheres.  The micro catheter was removed and flushed.  A Waltman's loop was created.  The ipsilateral right common iliac artery and right internal iliac artery were selected. 3-D angiography was performed. A micro catheter was advanced over a 0018 glide wire into the right uterine artery.  Angiography was performed.  Embolization was again performed with two vials 500-700 micron embospheres and one bile 700-900 micron embospheres.  The micro catheter and Cobra catheter were removed.  Right femoral angiography was performed. An Exoseal device was deployed without complication and hemostasis was achieved.  Findings: Imaging demonstrates bilateral pelvic anatomy and bilateral uterine artery angiography.  Expected anatomy is identified.  Post embolization angiography demonstrates relative stasis of flow and pruning of the vasculature to the uterus.  Complications: None.  IMPRESSION: Successful bilateral uterine artery embolization.   Original Report Authenticated By: Jolaine Click, M.D.    Ir US Guide  Vasc Access Right  02/23/2013  *RADIOLOGY REPORT*  Clinical Data/Indication: MENORRHAGIA.  PELVIC PAIN.  UTERINE FIBROIDS.  UTERINE ARTERY EMBOLIZATION  Sedation: Versed 4.0mg , Fentanyl 200 mg.  Total Moderate Sedation Time: 105 minutes.  Contrast Volume: 130 ml Omnipaque-300.  Additional Medications: 30 mg Toradol.  As antibiotic prophylaxis, Ancef  was ordered pre-procedure and administered intravenously within one hour of incision.  Fluoroscopy Time: 24.4 minutes.  Vessels selected:  Internal iliac artery bilateral third order.  Procedure: The procedure, risks, benefits, and alternatives were explained to the patient. Questions regarding the procedure were encouraged and answered. The patient understands and consents to the procedure.  The right groin was prepped with betadine in a sterile fashion, and a sterile drape was applied covering the operative field. A sterile gown and sterile gloves were used for the procedure.  Under sonographic guidance, a micropuncture needle was inserted into the right common femoral artery and upsized to a [place].  A 5- French sheath was inserted.  Cobra II catheter was advanced into the aorta.  The contralateral left common iliac artery was selected.  The internal iliac artery on the left was selected. 3-D angiography was performed.  A micro catheter was advanced over an 018 glide wire into the left uterine artery.  Angiography was performed.  Embolization was performed utilizing two vials 500-700 micron embospheres particles and four vials 700-900 micron embospheres.  The micro catheter was removed and flushed.  A Waltman's loop was created.  The ipsilateral right  common iliac artery and right internal iliac artery were selected. 3-D angiography was performed. A micro catheter was advanced over a 0018 glide wire into the right uterine artery.  Angiography was performed.  Embolization was again performed with two vials 500-700 micron embospheres and one bile 700-900 micron  embospheres.  The micro catheter and Cobra catheter were removed.  Right femoral angiography was performed. An Exoseal device was deployed without complication and hemostasis was achieved.  Findings: Imaging demonstrates bilateral pelvic anatomy and bilateral uterine artery angiography.  Expected anatomy is identified.  Post embolization angiography demonstrates relative stasis of flow and pruning of the vasculature to the uterus.  Complications: None.  IMPRESSION: Successful bilateral uterine artery embolization.   Original Report Authenticated By: Jolaine Click, M.D.    Ir Embo Tumor Organ Ischemia Infarct Inc Guide Roadmapping  02/23/2013  *RADIOLOGY REPORT*  Clinical Data/Indication: MENORRHAGIA.  PELVIC PAIN.  UTERINE FIBROIDS.  UTERINE ARTERY EMBOLIZATION  Sedation: Versed 4.0mg , Fentanyl 200 mg.  Total Moderate Sedation Time: 105 minutes.  Contrast Volume: 130 ml Omnipaque-300.  Additional Medications: 30 mg Toradol.  As antibiotic prophylaxis, Ancef  was ordered pre-procedure and administered intravenously within one hour of incision.  Fluoroscopy Time: 24.4 minutes.  Vessels selected:  Internal iliac artery bilateral third order.  Procedure: The procedure, risks, benefits, and alternatives were explained to the patient. Questions regarding the procedure were encouraged and answered. The patient understands and consents to the procedure.  The right groin was prepped with betadine in a sterile fashion, and a sterile drape was applied covering the operative field. A sterile gown and sterile gloves were used for the procedure.  Under sonographic guidance, a micropuncture needle was inserted into the right common femoral artery and upsized to a [place].  A 5- French sheath was inserted.  Cobra II catheter was advanced into the aorta.  The contralateral left common iliac artery was selected.  The internal iliac artery on the left was selected. 3-D angiography was performed.  A micro catheter was advanced over an  018 glide wire into the left uterine artery.  Angiography was performed.  Embolization was performed utilizing two vials 500-700 micron embospheres particles and four vials 700-900 micron embospheres.  The micro catheter was removed and flushed.  A Waltman's loop was created.  The ipsilateral right common iliac artery and right internal iliac artery were selected. 3-D angiography was performed. A micro catheter was advanced over a 0018 glide wire into the right uterine artery.  Angiography was performed.  Embolization was again performed with two vials 500-700 micron embospheres and one bile 700-900 micron embospheres.  The micro catheter and Cobra catheter were removed.  Right femoral angiography was performed. An Exoseal device was deployed without complication and hemostasis was achieved.  Findings: Imaging demonstrates bilateral pelvic anatomy and bilateral uterine artery angiography.  Expected anatomy is identified.  Post embolization angiography demonstrates relative stasis of flow and pruning of the vasculature to the uterus.  Complications: None.  IMPRESSION: Successful bilateral uterine artery embolization.   Original Report Authenticated By: Jolaine Click, M.D.     Assessment/Plan: S/p [place] Will DC PCA and offer po Vicodin Hopefully nausea will resolve and she will eat and get OOB Optimistic for DC later this morning/afternoon.    LOS: 1 day    Brayton El PA-C 02/24/2013 9:11 AM

## 2013-02-24 NOTE — Discharge Summary (Signed)
Physician Discharge Summary  Patient ID: Sharon Bell MRN: [id number] DOB/AGE: 1971/08/17 42 y.o.  Admit date: 02/23/2013 Discharge date: 02/24/2013  Admission Diagnoses: Principal Problem:   Fibroids  Discharge Diagnoses:  Principal Problem:   Fibroids    Procedures: Uterine Artery Embolization 4/21-Dr. Hoss  Discharged Condition: good  Hospital Course: HPI: Patient with symptomatic uterine fibroids presents today for bilateral uterine artery embolization Pt taken to IR suite and under moderate sedation, underwent successful [place]. NO immediate complications. She was admitted to floor in stable condition. Overnight had some nausea issues likely related to PCA Dilaudid. Has voided well on her own after Foley removed. Pain control is good with po Vicodin. Nausea better after PCA discontinued. She has been ambulating as well. No issues with right groin. She is determined to be stable for discharge. All of her medications, instructions, and follow up plans have been discussed.   Consults: None  Discharge Exam: Blood pressure 150/79, pulse 71, temperature 98 F (36.7 C), temperature source Oral, resp. rate 12, height 5\' 9"  (1.753 m), weight 217 lb 6 oz (98.6 kg), last menstrual period 02/10/2013, SpO2 99.00%. Lungs: CTA  Heart: Reg  Abdomen: soft, ND, NT  Ext: rt groin soft, NT, no hematoma   Disposition: Final discharge disposition not confirmed      Discharge Orders   Future Orders Complete By Expires     Call MD for:  persistant nausea and vomiting  As directed     Call MD for:  redness, tenderness, or signs of infection (pain, swelling, redness, odor or green/yellow discharge around incision site)  As directed     Call MD for:  severe uncontrolled pain  As directed     Call MD for:  temperature >100.4  As directed     Diet - low sodium heart healthy  As directed     Driving Restrictions  As directed     Comments:      Avoid driving for 3-4 days unless an  emergency    Increase activity slowly  As directed     May shower / Bathe  As directed     May walk up steps  As directed     Remove dressing in 24 hours  As directed         Medication List    STOP taking these medications       naproxen sodium 220 MG tablet  Commonly known as:  ANAPROX      TAKE these medications       DSS 100 MG Caps  Take 100 mg by mouth 2 (two) times daily.     HYDROcodone-acetaminophen 5-325 MG per tablet  Commonly known as:  NORCO/VICODIN  Take 1-2 tablets by mouth every 4 (four) hours as needed for pain.     ibuprofen 600 MG tablet  Commonly known as:  ADVIL,MOTRIN  Take 1 tablet (600 mg total) by mouth every 8 (eight) hours as needed for pain.     multivitamin tablet  Take 1 tablet by mouth daily.     ORTHO-NOVUM 1/35 (28) tablet  Generic drug:  norethindrone-ethinyl estradiol 1/35  Take 1 tablet by mouth daily.     promethazine 12.5 MG tablet  Commonly known as:  PHENERGAN  Take 1 tablet (12.5 mg total) by mouth every 6 (six) hours as needed for nausea.       Follow-up Information   Follow up with HOSS,ART A, MD. Schedule an appointment as soon as possible for  a visit in 2 weeks. (Office will call)    Contact information:   [facility] [address] [phone]       Signed: Brayton El PA-C 02/24/2013, 1:27 PM

## 2013-02-24 NOTE — Discharge Summary (Signed)
Agree 

## 2013-03-12 ENCOUNTER — Telehealth: Payer: Self-pay | Admitting: Emergency Medicine

## 2013-03-12 NOTE — Telephone Encounter (Signed)
PT WANTS REFILL ON PAIN MEDS AND IBU.  STILL HAVING PAIN POST PROCEDURE AND HAS BEEN BLEEDING SINCE THE 3RD DAY POST PROCEDURE.  9:57AM- CALLED [facility] AT [address] AND CALLED IN RX FOR VICODIN 5-325MG  1-2 PO 4-6 HRS AS NEEDED FOR PAIN.  #40.  10:02AM- CALLED PT BACK TO MAKE HER AWARE THAT RX WAS CALLED IN  AND TO STOP THE IBU SINCE SHE IS ACTIVELY BLEEDING AND IF SYMPTOMS BECOME WORSE TO CALL us OR GO TO THE ER.

## 2013-03-17 ENCOUNTER — Ambulatory Visit
Admission: RE | Admit: 2013-03-17 | Discharge: 2013-03-17 | Disposition: A | Discharge status: Home / Self Care | Payer: BC Managed Care – PPO | Source: Ambulatory Visit | Attending: Radiology | Admitting: Radiology

## 2013-03-17 DIAGNOSIS — D219 Benign neoplasm of connective and other soft tissue, unspecified: Secondary | ICD-10-CM

## 2013-05-26 ENCOUNTER — Telehealth: Payer: Self-pay | Admitting: Radiology

## 2013-05-26 NOTE — Telephone Encounter (Signed)
3 mo followup [place] call  Patient states that she has not experienced significant improvement in Sx 3 mo s/p [place].    Frequency:  Q month.  Length:  5-7 days.  Heavy flow x 3 days (changing pads & tampons Q1-2 hrs).  Also, cramping (taking Aleve as needed).  Has noticed some improvement with bulk Sx.    States that she prefers to schedule office visit followup at 6 mos post [place].  Instructed to call as needed.  Jomarie Gellis Carmell [place], RN 05/26/2013 12:02 PM

## 2013-06-19 ENCOUNTER — Other Ambulatory Visit: Payer: Self-pay | Admitting: Obstetrics & Gynecology

## 2013-07-08 ENCOUNTER — Other Ambulatory Visit: Payer: BC Managed Care – PPO

## 2013-07-28 ENCOUNTER — Other Ambulatory Visit (HOSPITAL_COMMUNITY): Payer: Self-pay | Admitting: Interventional Radiology

## 2013-07-28 DIAGNOSIS — D219 Benign neoplasm of connective and other soft tissue, unspecified: Secondary | ICD-10-CM

## 2013-09-03 ENCOUNTER — Other Ambulatory Visit: Payer: Self-pay | Admitting: Emergency Medicine

## 2013-09-03 ENCOUNTER — Ambulatory Visit
Admission: RE | Admit: 2013-09-03 | Discharge: 2013-09-03 | Disposition: A | Discharge status: Home / Self Care | Payer: BC Managed Care – PPO | Source: Ambulatory Visit | Attending: Interventional Radiology | Admitting: Interventional Radiology

## 2013-09-03 DIAGNOSIS — D259 Leiomyoma of uterus, unspecified: Secondary | ICD-10-CM

## 2013-09-03 DIAGNOSIS — D219 Benign neoplasm of connective and other soft tissue, unspecified: Secondary | ICD-10-CM

## 2013-09-03 NOTE — Progress Notes (Signed)
Patient states that she does not feel that her Sx have improved at 6 mo post [place].  Menstrual cycles:  Frequency:  Q 28 days.  Length:  6-7 days with heavy flow x 3-4 days requiring change of pads & tampons Q 1-2 hrs.  Moderate-severe cramping.  Uses Aleve as needed.  Occasional spotting with cramps between cycles.    Bulk Sx:  Bloating.  Abdominal discomfort.  Zavior Thomason Carmell [place], RN 09/03/2013 11:23 AM

## 2014-03-23 ENCOUNTER — Other Ambulatory Visit (HOSPITAL_COMMUNITY): Payer: Self-pay | Admitting: Interventional Radiology

## 2014-03-23 ENCOUNTER — Telehealth: Payer: Self-pay | Admitting: Radiology

## 2014-03-23 DIAGNOSIS — N920 Excessive and frequent menstruation with regular cycle: Secondary | ICD-10-CM

## 2014-03-23 DIAGNOSIS — D219 Benign neoplasm of connective and other soft tissue, unspecified: Secondary | ICD-10-CM

## 2014-03-23 NOTE — Telephone Encounter (Signed)
13 mo post [place]:  Menstrual cycles: Frequency:  q month Duration:  3 days (from 6 days prior to [place]). Continues to experience heavy flow.  Continues to have spotting most days between cycles.   Last follow up appointment with Dr Alden Hipp on 01/05/2014.  At that time, uterus measured 16 week size.    Will review patient status with Dr Art Barbie Banner.  Rexanna Louthan Riki Rusk, RN 03/23/2014 12:22 PM

## 2014-03-31 ENCOUNTER — Inpatient Hospital Stay
Admission: RE | Admit: 2014-03-31 | Discharge: 2014-03-31 | Disposition: A | Discharge status: Home / Self Care | Payer: Self-pay | Source: Ambulatory Visit | Attending: Interventional Radiology | Admitting: Interventional Radiology

## 2014-03-31 ENCOUNTER — Other Ambulatory Visit (HOSPITAL_COMMUNITY): Payer: Self-pay | Admitting: Interventional Radiology

## 2014-03-31 ENCOUNTER — Ambulatory Visit
Admission: RE | Admit: 2014-03-31 | Discharge: 2014-03-31 | Disposition: A | Discharge status: Home / Self Care | Payer: BC Managed Care – PPO | Source: Ambulatory Visit | Attending: Interventional Radiology | Admitting: Interventional Radiology

## 2014-03-31 DIAGNOSIS — D219 Benign neoplasm of connective and other soft tissue, unspecified: Secondary | ICD-10-CM

## 2014-03-31 DIAGNOSIS — D259 Leiomyoma of uterus, unspecified: Secondary | ICD-10-CM

## 2014-03-31 DIAGNOSIS — N920 Excessive and frequent menstruation with regular cycle: Secondary | ICD-10-CM

## 2014-03-31 NOTE — Progress Notes (Signed)
Pt here for 28yr post [place] , her cycles are now shorter only 3 days, but all of the 3 days are heavy.  Still wearing tampon and pads and changing q 2hrs. Having cramping and spotting between periods.  She did have a f/u PAP w/ her GYN in Feb and was neg.  He was still able to palpate the fibroid.  She is taking oral BC.

## 2015-03-31 ENCOUNTER — Encounter (HOSPITAL_COMMUNITY): Payer: Self-pay | Admitting: General Practice

## 2015-04-18 ENCOUNTER — Encounter (HOSPITAL_COMMUNITY): Payer: Self-pay | Admitting: *Deleted

## 2015-04-18 ENCOUNTER — Ambulatory Visit (HOSPITAL_COMMUNITY): Payer: BLUE CROSS/BLUE SHIELD | Admitting: Anesthesiology

## 2015-04-18 ENCOUNTER — Encounter (HOSPITAL_COMMUNITY)
Admission: RE | Disposition: A | Discharge status: Home / Self Care | Payer: Self-pay | Source: Ambulatory Visit | Attending: Obstetrics & Gynecology

## 2015-04-18 ENCOUNTER — Ambulatory Visit (HOSPITAL_COMMUNITY)
Admission: RE | Admit: 2015-04-18 | Discharge: 2015-04-18 | Disposition: A | Discharge status: Home / Self Care | Payer: BLUE CROSS/BLUE SHIELD | Source: Ambulatory Visit | Attending: Obstetrics & Gynecology | Admitting: Obstetrics & Gynecology

## 2015-04-18 DIAGNOSIS — D259 Leiomyoma of uterus, unspecified: Secondary | ICD-10-CM | POA: Diagnosis not present

## 2015-04-18 DIAGNOSIS — N938 Other specified abnormal uterine and vaginal bleeding: Secondary | ICD-10-CM | POA: Diagnosis not present

## 2015-04-18 HISTORY — PX: DILATION AND EVACUATION: SHX1459

## 2015-04-18 HISTORY — PX: HYSTEROSCOPY WITH NOVASURE: SHX5574

## 2015-04-18 LAB — CBC
HEMATOCRIT: 37.5 % (ref 36.0–46.0)
Hemoglobin: 12.6 g/dL (ref 12.0–15.0)
MCH: 29.9 pg (ref 26.0–34.0)
MCHC: 33.6 g/dL (ref 30.0–36.0)
MCV: 89.1 fL (ref 78.0–100.0)
Platelets: 328 10*3/uL (ref 150–400)
RBC: 4.21 MIL/uL (ref 3.87–5.11)
RDW: 13 % (ref 11.5–15.5)
WBC: 5.4 10*3/uL (ref 4.0–10.5)

## 2015-04-18 LAB — PREGNANCY, URINE: Preg Test, Ur: NEGATIVE

## 2015-04-18 SURGERY — HYSTEROSCOPY WITH NOVASURE
Anesthesia: General | Site: Vagina

## 2015-04-18 MED ORDER — PROPOFOL 10 MG/ML IV BOLUS
INTRAVENOUS | Status: AC
  Filled 2015-04-18: qty 20

## 2015-04-18 MED ORDER — HYDROCODONE-ACETAMINOPHEN 5-325 MG PO TABS: 1.0000 | ORAL_TABLET | Freq: Four times a day (QID) | ORAL | Status: DC | PRN

## 2015-04-18 MED ORDER — KETOROLAC TROMETHAMINE 30 MG/ML IJ SOLN
INTRAMUSCULAR | Status: AC
  Filled 2015-04-18: qty 1

## 2015-04-18 MED ORDER — MIDAZOLAM HCL 2 MG/2ML IJ SOLN
INTRAMUSCULAR | Status: AC
  Filled 2015-04-18: qty 2

## 2015-04-18 MED ORDER — DEXAMETHASONE SODIUM PHOSPHATE 4 MG/ML IJ SOLN
INTRAMUSCULAR | Status: AC
  Filled 2015-04-18: qty 1

## 2015-04-18 MED ORDER — HYDROMORPHONE HCL 1 MG/ML IJ SOLN
INTRAMUSCULAR | Status: AC
  Filled 2015-04-18: qty 1

## 2015-04-18 MED ORDER — ONDANSETRON HCL 4 MG/2ML IJ SOLN
INTRAMUSCULAR | Status: DC | PRN
  Administered 2015-04-18: 4 mg via INTRAVENOUS

## 2015-04-18 MED ORDER — SCOPOLAMINE 1 MG/3DAYS TD PT72: 1.0000 | MEDICATED_PATCH | Freq: Once | TRANSDERMAL | Status: DC

## 2015-04-18 MED ORDER — GLYCINE 1.5 % IR SOLN
Status: DC | PRN
  Administered 2015-04-18: 3000 mL

## 2015-04-18 MED ORDER — LACTATED RINGERS IV SOLN
INTRAVENOUS | Status: DC | PRN
  Administered 2015-04-18: 3000 mL via INTRAUTERINE

## 2015-04-18 MED ORDER — LIDOCAINE HCL 2 % IJ SOLN
INTRAMUSCULAR | Status: DC | PRN
  Administered 2015-04-18: 20 mL

## 2015-04-18 MED ORDER — LACTATED RINGERS IV SOLN
INTRAVENOUS | Status: DC
  Administered 2015-04-18 (×3): via INTRAVENOUS

## 2015-04-18 MED ORDER — KETOROLAC TROMETHAMINE 30 MG/ML IJ SOLN: 30.0000 mg | Freq: Once | INTRAMUSCULAR | Status: DC | PRN

## 2015-04-18 MED ORDER — ONDANSETRON HCL 4 MG/2ML IJ SOLN
INTRAMUSCULAR | Status: AC
  Filled 2015-04-18: qty 2

## 2015-04-18 MED ORDER — LIDOCAINE HCL (CARDIAC) 20 MG/ML IV SOLN
INTRAVENOUS | Status: DC | PRN
  Administered 2015-04-18: 50 mg via INTRAVENOUS

## 2015-04-18 MED ORDER — SCOPOLAMINE 1 MG/3DAYS TD PT72
MEDICATED_PATCH | TRANSDERMAL | Status: AC
  Administered 2015-04-18: 1.5 mg via TRANSDERMAL
  Filled 2015-04-18: qty 1

## 2015-04-18 MED ORDER — DEXAMETHASONE SODIUM PHOSPHATE 10 MG/ML IJ SOLN
INTRAMUSCULAR | Status: DC | PRN
  Administered 2015-04-18: 4 mg via INTRAVENOUS

## 2015-04-18 MED ORDER — PROPOFOL 10 MG/ML IV BOLUS
INTRAVENOUS | Status: DC | PRN
  Administered 2015-04-18: 180 mg via INTRAVENOUS

## 2015-04-18 MED ORDER — LIDOCAINE HCL 2 % IJ SOLN
INTRAMUSCULAR | Status: AC
  Filled 2015-04-18: qty 20

## 2015-04-18 MED ORDER — KETOROLAC TROMETHAMINE 30 MG/ML IJ SOLN
INTRAMUSCULAR | Status: DC | PRN
  Administered 2015-04-18: 30 mg via INTRAVENOUS

## 2015-04-18 MED ORDER — LIDOCAINE HCL (CARDIAC) 20 MG/ML IV SOLN
INTRAVENOUS | Status: AC
  Filled 2015-04-18: qty 5

## 2015-04-18 MED ORDER — PROMETHAZINE HCL 25 MG/ML IJ SOLN
6.2500 mg | INTRAMUSCULAR | Status: DC | PRN
  Administered 2015-04-18: 12.5 mg via INTRAVENOUS

## 2015-04-18 MED ORDER — MEPERIDINE HCL 25 MG/ML IJ SOLN: 6.2500 mg | INTRAMUSCULAR | Status: DC | PRN

## 2015-04-18 MED ORDER — PROMETHAZINE HCL 25 MG/ML IJ SOLN
INTRAMUSCULAR | Status: AC
  Filled 2015-04-18: qty 1

## 2015-04-18 MED ORDER — MIDAZOLAM HCL 2 MG/2ML IJ SOLN
INTRAMUSCULAR | Status: DC | PRN
Start: 2015-04-18 — End: 2015-04-18
  Administered 2015-04-18: 2 mg via INTRAVENOUS

## 2015-04-18 MED ORDER — HYDROMORPHONE HCL 1 MG/ML IJ SOLN
0.2500 mg | INTRAMUSCULAR | Status: DC | PRN
  Administered 2015-04-18: 0.5 mg via INTRAVENOUS

## 2015-04-18 MED ORDER — FENTANYL CITRATE (PF) 250 MCG/5ML IJ SOLN
INTRAMUSCULAR | Status: AC
  Filled 2015-04-18: qty 5

## 2015-04-18 MED ORDER — FENTANYL CITRATE (PF) 100 MCG/2ML IJ SOLN
INTRAMUSCULAR | Status: DC | PRN
  Administered 2015-04-18: 100 ug via INTRAVENOUS
  Administered 2015-04-18: 25 ug via INTRAVENOUS

## 2015-04-18 SURGICAL SUPPLY — 20 items
ABLATOR ENDOMETRIAL BIPOLAR (ABLATOR) ×5 IMPLANT
CATH ROBINSON RED A/P 16FR (CATHETERS) ×5 IMPLANT
CLOTH BEACON ORANGE TIMEOUT ST (SAFETY) ×5 IMPLANT
CONTAINER PREFILL 10% NBF 60ML (FORM) ×10 IMPLANT
DRSG TELFA 3X8 NADH (GAUZE/BANDAGES/DRESSINGS) ×5 IMPLANT
ELECTRODE ROLLER BARREL 22FR (ELECTROSURGICAL) ×5 IMPLANT
GLOVE ECLIPSE 6.0 STRL STRAW (GLOVE) ×10 IMPLANT
GLYCINE 1.5% IRRIG UROMATIC (IV SOLUTION) ×10 IMPLANT
GOWN STRL REUS W/TWL LRG LVL3 (GOWN DISPOSABLE) ×15 IMPLANT
IV LACTATED RINGER IRRG 3000ML (IV SOLUTION) ×3
IV LR IRRIG 3000ML ARTHROMATIC (IV SOLUTION) ×3 IMPLANT
LOOP ANGLED CUTTING 22FR (CUTTING LOOP) ×5 IMPLANT
PACK VAGINAL MINOR WOMEN LF (CUSTOM PROCEDURE TRAY) ×5 IMPLANT
PAD OB MATERNITY 4.3X12.25 (PERSONAL CARE ITEMS) ×5 IMPLANT
PAD PREP 24X48 CUFFED NSTRL (MISCELLANEOUS) ×5 IMPLANT
SET BERKELEY SUCTION TUBING (SUCTIONS) ×5 IMPLANT
TOWEL OR 17X24 6PK STRL BLUE (TOWEL DISPOSABLE) ×10 IMPLANT
TUBING AQUILEX INFLOW (TUBING) ×5 IMPLANT
VACURETTE 7MM CVD STRL WRAP (CANNULA) ×5 IMPLANT
WATER STERILE IRR 1000ML POUR (IV SOLUTION) ×5 IMPLANT

## 2015-04-18 NOTE — Anesthesia Procedure Notes (Signed)
Procedure Name: LMA Insertion Date/Time: 04/18/2015 1:37 PM Performed by: Casimer Lanius A Pre-anesthesia Checklist: Patient being monitored, Patient identified, Emergency Drugs available and Suction available Patient Re-evaluated:Patient Re-evaluated prior to inductionOxygen Delivery Method: Circle system utilized Preoxygenation: Pre-oxygenation with 100% oxygen Intubation Type: IV induction and Inhalational induction Ventilation: Mask ventilation without difficulty LMA: LMA inserted LMA Size: 4.0 Number of attempts: 1 Dental Injury: Teeth and Oropharynx as per pre-operative assessment

## 2015-04-18 NOTE — Anesthesia Preprocedure Evaluation (Signed)
Anesthesia Evaluation  Patient identified by MRN, date of birth, ID band Patient awake    Reviewed: Allergy & Precautions, H&P , NPO status , Patient's Chart, lab work & pertinent test results  Airway Mallampati: I  TM Distance: >3 FB Neck ROM: full    Dental no notable dental hx. (+) Teeth Intact   Pulmonary neg pulmonary ROS,    Pulmonary exam normal       Cardiovascular negative cardio ROS Normal cardiovascular exam    Neuro/Psych negative neurological ROS  negative psych ROS   GI/Hepatic negative GI ROS, Neg liver ROS,   Endo/Other  negative endocrine ROS  Renal/GU negative Renal ROS     Musculoskeletal   Abdominal Normal abdominal exam  (+)   Peds  Hematology negative hematology ROS (+)   Anesthesia Other Findings   Reproductive/Obstetrics negative OB ROS                             Anesthesia Physical Anesthesia Plan  ASA: II  Anesthesia Plan: General   Post-op Pain Management:    Induction: Intravenous  Airway Management Planned: LMA  Additional Equipment:   Intra-op Plan:   Post-operative Plan:   Informed Consent: I have reviewed the patients History and Physical, chart, labs and discussed the procedure including the risks, benefits and alternatives for the proposed anesthesia with the patient or authorized representative who has indicated his/her understanding and acceptance.     Plan Discussed with: CRNA and Surgeon  Anesthesia Plan Comments:         Anesthesia Quick Evaluation

## 2015-04-18 NOTE — H&P (Signed)
Sharon Bell is an 44 y.o. female. She presents with myoma uterus and menorrhagia.  Last pap: normal Date: 2015 OB History: G0, P0   Menstrual History:  Patient's last menstrual period was 03/10/2015 (approximate).    Past Medical History  Diagnosis Date  . Fibroids 2009    Past Surgical History  Procedure Laterality Date  . Bilateral repair of hammertoe      4th toe of both feet  . Polyknital   1990    polyknital cyst excision   . Uterine fibroid embolization  02/23/2013    History reviewed. No pertinent family history.  Social History:  reports that she has never smoked. She has never used smokeless tobacco. She reports that she drinks alcohol. She reports that she does not use illicit drugs.  Allergies: No Known Allergies  Prescriptions prior to admission  Medication Sig Dispense Refill Last Dose  . acidophilus (RISAQUAD) CAPS capsule Take 1 capsule by mouth daily.   Past Week at Unknown time  . ibuprofen (ADVIL,MOTRIN) 200 MG tablet Take 400 mg by mouth every 6 (six) hours as needed for mild pain, moderate pain or cramping.   Past Week at Unknown time  . Multiple Vitamin (MULTIVITAMIN) tablet Take 1 tablet by mouth daily.   04/17/2015 at Unknown time  . norethindrone-ethinyl estradiol 1/35 ([place] 1/35, 28,) tablet Take 1 tablet by mouth daily.   04/17/2015 at Unknown time  . docusate sodium 100 MG CAPS Take 100 mg by mouth 2 (two) times daily. 20 capsule 0   . HYDROcodone-acetaminophen (NORCO/VICODIN) 5-325 MG per tablet Take 1-2 tablets by mouth every 4 (four) hours as needed for pain. 30 tablet 0   . ibuprofen (ADVIL,MOTRIN) 600 MG tablet Take 1 tablet (600 mg total) by mouth every 8 (eight) hours as needed for pain. (Patient not taking: Reported on 04/06/2015) 30 tablet 0   . promethazine (PHENERGAN) 12.5 MG tablet Take 1 tablet (12.5 mg total) by mouth every 6 (six) hours as needed for nausea. 30 tablet 0     Review of Systems  Constitutional:  Negative.   Eyes: Negative.   Cardiovascular: Negative.   Gastrointestinal: Negative.   Musculoskeletal: Negative.   Skin: Negative.     Blood pressure 120/78, pulse 71, temperature 98.2 F (36.8 C), temperature source Oral, resp. rate 18, height 5\' 9"  (1.753 m), weight 180 lb (81.647 kg), last menstrual period 03/10/2015, SpO2 100 %. Physical Exam  Constitutional: She appears well-developed.  HENT:  Head: Normocephalic.  Neck: Normal range of motion.  Cardiovascular: Normal rate.   Respiratory: Effort normal.  GI: Soft.  Genitourinary: Vagina normal.  Uterus is enlarged with myoma  Neurological: She is alert.  Skin: Skin is warm.    Results for orders placed or performed during the hospital encounter of 04/18/15 (from the past 24 hour(s))  CBC     Status: None   Collection Time: 04/18/15 11:58 AM  Result Value Ref Range   WBC 5.4 4.0 - 10.5 K/uL   RBC 4.21 3.87 - 5.11 MIL/uL   Hemoglobin 12.6 12.0 - 15.0 g/dL   HCT 37.5 36.0 - 46.0 %   MCV 89.1 78.0 - 100.0 fL   MCH 29.9 26.0 - 34.0 pg   MCHC 33.6 30.0 - 36.0 g/dL   RDW 13.0 11.5 - 15.5 %   Platelets 328 150 - 400 K/uL  Pregnancy, urine     Status: None   Collection Time: 04/18/15 12:02 PM  Result Value Ref Range  Preg Test, Ur NEGATIVE NEGATIVE    No results found.  Assessment/Plan: Myoma uterus.  SIS showed no large submucous myoma although she did appear to have a small uterine polyp.  Will proceed with hysteroscopy, polypectomy, and Novasure.  Belky Mundo D 04/18/2015, 1:04 PM

## 2015-04-18 NOTE — Progress Notes (Signed)
I have interviewed and performed the pertinent exams on my patient to confirm that there have been no significant changes in her condition since the dictation of her history and physical exam.  

## 2015-04-18 NOTE — Anesthesia Postprocedure Evaluation (Signed)
Anesthesia Post Note  Patient: Sharon Bell  Procedure(s) Performed: Procedure(s) (LRB): HYSTEROSCOPY WITH RESECTION OF ENDOMETRIAL POLYP using resectoscope, suction dilatation and curratage (N/A) DILATATION AND EVACUATION  Anesthesia type: General  Patient location: PACU  Post pain: Pain level controlled  Post assessment: Post-op Vital signs reviewed  Last Vitals:  Filed Vitals:   04/18/15 1615  BP: 131/82  Pulse: 66  Temp:   Resp: 34    Post vital signs: Reviewed  Level of consciousness: sedated  Complications: No apparent anesthesia complications

## 2015-04-18 NOTE — Discharge Instructions (Signed)
Hysteroscopy, Care After Refer to this sheet in the next few weeks. These instructions provide you with information on caring for yourself after your procedure. Your health care provider may also give you more specific instructions. Your treatment has been planned according to current medical practices, but problems sometimes occur. Call your health care provider if you have any problems or questions after your procedure.  WHAT TO EXPECT AFTER THE PROCEDURE After your procedure, it is typical to have the following:  You may have some cramping. This normally lasts for a couple days.  You may have bleeding. This can vary from light spotting for a few days to menstrual-like bleeding for 3-7 days. HOME CARE INSTRUCTIONS  Rest for the first 1-2 days after the procedure.  Only take over-the-counter or prescription medicines as directed by your health care provider. Do not take aspirin. It can increase the chances of bleeding.  Take showers instead of baths for 2 weeks or as directed by your health care provider.  Do not drive for 24 hours or as directed.  Do not drink alcohol while taking pain medicine.  Do not use tampons, douche, or have sexual intercourse for 2 weeks or until your health care provider says it is okay.  Take your temperature twice a day for 4-5 days. Write it down each time.  Follow your health care provider's advice about diet, exercise, and lifting.  If you develop constipation, you may:  Take a mild laxative if your health care provider approves.  Add bran foods to your diet.  Drink enough fluids to keep your urine clear or pale yellow.  Try to have someone with you or available to you for the first 24-48 hours, especially if you were given a general anesthetic.  Follow up with your health care provider as directed. SEEK MEDICAL CARE IF:  You feel dizzy or lightheaded.  You feel sick to your stomach (nauseous).  You have abnormal vaginal discharge.  You  have a rash.  You have pain that is not controlled with medicine. SEEK IMMEDIATE MEDICAL CARE IF:  You have bleeding that is heavier than a normal menstrual period.  You have a fever.  You have increasing cramps or pain, not controlled with medicine.  You have new belly (abdominal) pain.  You pass out.  You have pain in the tops of your shoulders (shoulder strap areas).  You have shortness of breath. Document Released: 08/12/2013 Document Reviewed: 08/12/2013 [facility] Patient Information 2015 [place], [place]. This information is not intended to replace advice given to you by your health care provider. Make sure you discuss any questions you have with your health care provider.  MAY TAKE THE PATCH BEHIND YOUR EAR OFF IN 48 HOURS!!  [place] HANDS VIGOROUSLY AFTER REMOVAL!! MAY TAKE IBUPROFEN (MOTRIN, ADVIL) OR ALEVE AFTER 8:53 PM FOR PAIN!!

## 2015-04-18 NOTE — Op Note (Signed)
Patient Name: Sharon Bell MRN: [id number]  Date of Surgery: 04/18/2015    PREOPERATIVE DIAGNOSIS: DUB / MYOMA  POSTOPERATIVE DIAGNOSIS: DUB   PROCEDURE: D&C, Hysteroscopy, Endometrial resection.  SURGEON: Alyssha Housh D. Deatra Ina M.D.  ANESTHESIA: General endotracheal  ESTIMATED BLOOD LOSS: 100 ml  FINDINGS: Distorted endometrial cavity from intramural myoma.  No polyps or submucous myoma identified   INDICATIONS: Abnormal uterine bleeding  PROCEDURE IN DETAIL: The patient was taken to the OR and placed in the dors-lithotomy position. The perineum and vagina were prepped and draped in a sterile fashion. Bimanual exam revealed an anteverted,12 week sized uterus with large cul de sac myoma. 10 ml of 2% lidocaine was infiltrated in the paracervical tissue and Pratt dilators were used to open the cervix to 21 [place]. Hysteroscopy was performed and the lower segment was elongated by myoma and the endometrial cavity was distorted.  It was felt that a Novasure could not be deployed successfully.  The decision was made to proceed with endometrial resection.  The medium was changed to Glycine and the loop resectoscope was used to cauterize and resect the endometrium.  The endometrial fragments were removed with suction and sharp D&C.  The procedure was then terminated and the patient left the operating room in good condition.

## 2015-04-18 NOTE — Transfer of Care (Signed)
Immediate Anesthesia Transfer of Care Note  Patient: Sharon Bell  Procedure(s) Performed: Procedure(s) with comments: HYSTEROSCOPY WITH RESECTION OF ENDOMETRIAL POLYP using resectoscope, suction dilatation and curratage (N/A) - RESECTOSCOPE AVAILABLE DILATATION AND EVACUATION  Patient Location: PACU  Anesthesia Type:General  Level of Consciousness: awake  Airway & Oxygen Therapy: Patient Spontanous Breathing  Post-op Assessment: Report given to PACU RN  Post vital signs: stable  Filed Vitals:   04/18/15 1201  BP: 120/78  Pulse: 71  Temp: 36.8 C  Resp: 18    Complications: No apparent anesthesia complications

## 2015-04-19 ENCOUNTER — Encounter (HOSPITAL_COMMUNITY): Payer: Self-pay | Admitting: Obstetrics & Gynecology

## 2016-06-18 ENCOUNTER — Ambulatory Visit (HOSPITAL_COMMUNITY)
Admission: EM | Admit: 2016-06-18 | Discharge: 2016-06-18 | Disposition: A | Discharge status: Home / Self Care | Payer: BLUE CROSS/BLUE SHIELD | Attending: Family Medicine | Admitting: Family Medicine

## 2016-06-18 ENCOUNTER — Encounter (HOSPITAL_COMMUNITY): Payer: Self-pay | Admitting: Emergency Medicine

## 2016-06-18 DIAGNOSIS — L0591 Pilonidal cyst without abscess: Secondary | ICD-10-CM | POA: Diagnosis not present

## 2016-06-18 MED ORDER — CEFTRIAXONE SODIUM 1 G IJ SOLR
INTRAMUSCULAR | Status: AC
  Filled 2016-06-18: qty 10

## 2016-06-18 MED ORDER — HYDROCODONE-ACETAMINOPHEN 5-325 MG PO TABS: 2.0000 | ORAL_TABLET | ORAL | 0 refills | Status: DC | PRN

## 2016-06-18 MED ORDER — SULFAMETHOXAZOLE-TRIMETHOPRIM 800-160 MG PO TABS: 1.0000 | ORAL_TABLET | Freq: Two times a day (BID) | ORAL | 0 refills | Status: AC

## 2016-06-18 MED ORDER — CEFTRIAXONE SODIUM 1 G IJ SOLR
1.0000 g | Freq: Once | INTRAMUSCULAR | Status: AC
  Administered 2016-06-18: 1 g via INTRAMUSCULAR

## 2016-06-18 MED ORDER — LIDOCAINE HCL (PF) 1 % IJ SOLN
INTRAMUSCULAR | Status: AC
  Filled 2016-06-18: qty 2

## 2016-06-18 NOTE — ED Triage Notes (Signed)
History of pilonidal cyst.  Patient reports the last one was 1991 and surgery involved .  Patient has been soaking in epsom salt solution.  Patient continues to have pain.

## 2016-11-16 ENCOUNTER — Other Ambulatory Visit: Payer: Self-pay | Admitting: Obstetrics & Gynecology

## 2016-11-16 DIAGNOSIS — N63 Unspecified lump in unspecified breast: Secondary | ICD-10-CM

## 2017-06-03 ENCOUNTER — Observation Stay (HOSPITAL_COMMUNITY)
Admission: EM | Admit: 2017-06-03 | Discharge: 2017-06-05 | Disposition: A | Discharge status: Home / Self Care | Payer: BLUE CROSS/BLUE SHIELD | Attending: Surgery | Admitting: Surgery

## 2017-06-03 ENCOUNTER — Encounter (HOSPITAL_COMMUNITY): Payer: Self-pay | Admitting: Emergency Medicine

## 2017-06-03 ENCOUNTER — Observation Stay (HOSPITAL_COMMUNITY): Payer: BLUE CROSS/BLUE SHIELD

## 2017-06-03 DIAGNOSIS — K611 Rectal abscess: Principal | ICD-10-CM | POA: Diagnosis present

## 2017-06-03 DIAGNOSIS — K8020 Calculus of gallbladder without cholecystitis without obstruction: Secondary | ICD-10-CM | POA: Diagnosis not present

## 2017-06-03 DIAGNOSIS — Z793 Long term (current) use of hormonal contraceptives: Secondary | ICD-10-CM | POA: Insufficient documentation

## 2017-06-03 DIAGNOSIS — L0291 Cutaneous abscess, unspecified: Secondary | ICD-10-CM

## 2017-06-03 DIAGNOSIS — Z9889 Other specified postprocedural states: Secondary | ICD-10-CM | POA: Diagnosis not present

## 2017-06-03 DIAGNOSIS — D251 Intramural leiomyoma of uterus: Secondary | ICD-10-CM | POA: Insufficient documentation

## 2017-06-03 DIAGNOSIS — L0501 Pilonidal cyst with abscess: Secondary | ICD-10-CM | POA: Insufficient documentation

## 2017-06-03 DIAGNOSIS — N764 Abscess of vulva: Secondary | ICD-10-CM | POA: Insufficient documentation

## 2017-06-03 LAB — CBC WITH DIFFERENTIAL/PLATELET
Basophils Absolute: 0 10*3/uL (ref 0.0–0.1)
Basophils Relative: 0 %
Eosinophils Absolute: 0.1 10*3/uL (ref 0.0–0.7)
Eosinophils Relative: 0 %
HCT: 38.3 % (ref 36.0–46.0)
Hemoglobin: 12.6 g/dL (ref 12.0–15.0)
Lymphocytes Relative: 15 %
Lymphs Abs: 1.9 10*3/uL (ref 0.7–4.0)
MCH: 28.9 pg (ref 26.0–34.0)
MCHC: 32.9 g/dL (ref 30.0–36.0)
MCV: 87.8 fL (ref 78.0–100.0)
Monocytes Absolute: 0.8 10*3/uL (ref 0.1–1.0)
Monocytes Relative: 7 %
Neutro Abs: 9.4 10*3/uL — ABNORMAL HIGH (ref 1.7–7.7)
Neutrophils Relative %: 78 %
Platelets: 380 10*3/uL (ref 150–400)
RBC: 4.36 MIL/uL (ref 3.87–5.11)
RDW: 13.4 % (ref 11.5–15.5)
WBC: 12.2 10*3/uL — ABNORMAL HIGH (ref 4.0–10.5)

## 2017-06-03 LAB — BASIC METABOLIC PANEL
Anion gap: 6 (ref 5–15)
BUN: 9 mg/dL (ref 6–20)
CO2: 26 mmol/L (ref 22–32)
Calcium: 8.5 mg/dL — ABNORMAL LOW (ref 8.9–10.3)
Chloride: 104 mmol/L (ref 101–111)
Creatinine, Ser: 0.79 mg/dL (ref 0.44–1.00)
GFR calc Af Amer: >60 mL/min (ref >60)
GFR calc non Af Amer: >60 mL/min (ref >60)
Glucose, Bld: 98 mg/dL (ref 65–99)
Potassium: 3.3 mmol/L — ABNORMAL LOW (ref 3.5–5.1)
Sodium: 136 mmol/L (ref 135–145)

## 2017-06-03 MED ORDER — MORPHINE SULFATE (PF) 2 MG/ML IV SOLN: 2.0000 mg | INTRAVENOUS | Status: DC | PRN

## 2017-06-03 MED ORDER — ONDANSETRON 4 MG PO TBDP: 4.0000 mg | ORAL_TABLET | Freq: Four times a day (QID) | ORAL | Status: DC | PRN

## 2017-06-03 MED ORDER — ONDANSETRON HCL 4 MG/2ML IJ SOLN: 4.0000 mg | Freq: Four times a day (QID) | INTRAMUSCULAR | Status: DC | PRN

## 2017-06-03 MED ORDER — OXYCODONE HCL 5 MG PO TABS: 5.0000 mg | ORAL_TABLET | ORAL | Status: DC | PRN

## 2017-06-03 MED ORDER — OXYCODONE-ACETAMINOPHEN 5-325 MG PO TABS
1.0000 | ORAL_TABLET | Freq: Once | ORAL | Status: AC
  Administered 2017-06-03: 1 via ORAL
  Filled 2017-06-03: qty 1

## 2017-06-03 MED ORDER — LIDOCAINE-EPINEPHRINE 2 %-1:100000 IJ SOLN
20.0000 mL | Freq: Once | INTRAMUSCULAR | Status: AC
  Administered 2017-06-03: 20 mL
  Filled 2017-06-03 (×2): qty 20

## 2017-06-03 MED ORDER — HYDRALAZINE HCL 20 MG/ML IJ SOLN
10.0000 mg | INTRAMUSCULAR | Status: DC | PRN
  Filled 2017-06-03: qty 0.5

## 2017-06-03 MED ORDER — PIPERACILLIN-TAZOBACTAM 3.375 G IVPB
3.3750 g | Freq: Once | INTRAVENOUS | Status: AC
  Administered 2017-06-03: 3.375 g via INTRAVENOUS
  Filled 2017-06-03: qty 50

## 2017-06-03 MED ORDER — IOPAMIDOL (ISOVUE-300) INJECTION 61%
100.0000 mL | Freq: Once | INTRAVENOUS | Status: AC | PRN
  Administered 2017-06-03: 100 mL via INTRAVENOUS

## 2017-06-03 MED ORDER — PIPERACILLIN-TAZOBACTAM 3.375 G IVPB
3.3750 g | Freq: Three times a day (TID) | INTRAVENOUS | Status: DC
  Administered 2017-06-04 (×2): 3.375 g via INTRAVENOUS
  Filled 2017-06-03: qty 50

## 2017-06-03 MED ORDER — IOPAMIDOL (ISOVUE-300) INJECTION 61%
INTRAVENOUS | Status: AC
  Filled 2017-06-03: qty 100

## 2017-06-03 MED ORDER — DIPHENHYDRAMINE HCL 25 MG PO CAPS: 25.0000 mg | ORAL_CAPSULE | Freq: Four times a day (QID) | ORAL | Status: DC | PRN

## 2017-06-03 MED ORDER — DIPHENHYDRAMINE HCL 50 MG/ML IJ SOLN: 25.0000 mg | Freq: Four times a day (QID) | INTRAMUSCULAR | Status: DC | PRN

## 2017-06-03 MED ORDER — SODIUM CHLORIDE 0.9 % IV SOLN
INTRAVENOUS | Status: DC
  Administered 2017-06-03: 17:00:00 via INTRAVENOUS

## 2017-06-03 NOTE — ED Triage Notes (Signed)
Patient reports since April she has been dealing with re-occurring cyst on her tailbone area that will come up and burst.  Patient states that that area is getting large around cyst.

## 2017-06-03 NOTE — ED Notes (Signed)
Significant other at bedside.

## 2017-06-03 NOTE — ED Notes (Signed)
Bed: WTR7 Expected date:  Expected time:  Means of arrival:  Comments: 

## 2017-06-03 NOTE — H&P (Signed)
[facility] Consult/Admission Note  Sharon Bell Jun 15, 1971  [id number].    Requesting MD: Gwenyth Bender, PA Chief Complaint/Reason for Consult: perirectal abscess  HPI:   Pt is a 46 year old otherwise healthy female who presented to the ED with complaints of multiple abscesses. Few drained by the EDP. Pt states she began feeling pressure and pain in her right buttock worse with touch roughly 2 months ago. Pain is severe with touch, non radiating, medicine given in ED helped. Pt states history of boils but never had this many at once. Pt states she had a pilonidal abscess when she was 89 which required surgery and now it has returned. It was drained by the EDP. No other complaints. No fever, chills, abdominal pain. WBC 12.2.   ROS:  Review of Systems  Constitutional: Negative for chills and fever.  Respiratory: Negative for shortness of breath.   Cardiovascular: Negative for chest pain.  Gastrointestinal: Negative for abdominal pain, nausea and vomiting.  Skin:       Abscess of groin and right buttock  Neurological: Negative for loss of consciousness and weakness.  All other systems reviewed and are negative.    No family history on file.  Past Medical History:  Diagnosis Date  . Fibroids 2009    Past Surgical History:  Procedure Laterality Date  . Bilateral repair of hammertoe     4th toe of both feet  . DILATION AND EVACUATION  04/18/2015   Procedure: DILATATION AND EVACUATION;  Surgeon: Alden Hipp, MD;  Location: [facility];  Service: Gynecology;;  . HYSTEROSCOPY WITH NOVASURE N/A 04/18/2015   Procedure: HYSTEROSCOPY WITH RESECTION OF ENDOMETRIAL POLYP using resectoscope, suction dilatation and curratage;  Surgeon: Alden Hipp, MD;  Location: [facility];  Service: Gynecology;  Laterality: N/A;  RESECTOSCOPE AVAILABLE  . polyknital   1990   polyknital cyst excision   . UTERINE FIBROID EMBOLIZATION  02/23/2013    Social History:  reports that she  has never smoked. She has never used smokeless tobacco. She reports that she drinks alcohol. She reports that she does not use drugs.  Allergies: No Known Allergies   (Not in a hospital admission)  Blood pressure 108/70, pulse 86, temperature 97.8 F (36.6 C), temperature source Oral, resp. rate 16, height 5\' 9"  (1.753 m), weight 193 lb (87.5 kg), last menstrual period 05/27/2017, SpO2 100 %.  Physical Exam  Constitutional: She is oriented to person, place, and time and well-developed, well-nourished, and in no distress. Vital signs are normal. No distress.  HENT:  Head: Normocephalic and atraumatic.  Nose: Nose normal.  Mouth/Throat: Oropharynx is clear and moist. No oropharyngeal exudate.  Eyes: Pupils are equal, round, and reactive to light. Conjunctivae are normal. Right eye exhibits no discharge. Left eye exhibits no discharge. No scleral icterus.  Neck: Normal range of motion. Neck supple. No tracheal deviation present. No thyromegaly present.  Cardiovascular: Normal rate, regular rhythm, normal heart sounds and intact distal pulses.  Exam reveals no gallop and no friction rub.   No murmur heard. Pulses:      Radial pulses are 2+ on the right side, and 2+ on the left side.  Pulmonary/Chest: Effort normal and breath sounds normal. No respiratory distress. She has no decreased breath sounds. She has no wheezes. She has no rhonchi. She has no rales.  Abdominal: Soft. Bowel sounds are normal. She exhibits no distension and no mass. There is no tenderness. There is no rebound and no guarding.  Genitourinary:  Genitourinary  Comments: Small left groin abscess, right buttock fluctuant mass with surrounding induration and pain with an open area of purulent drainage, pilonidal abscess dressed after I&D  Musculoskeletal: Normal range of motion. She exhibits no edema.  Lymphadenopathy:    She has no cervical adenopathy.  Neurological: She is alert and oriented to person, place, and time.   Skin: Skin is warm and dry. No rash noted. She is not diaphoretic.  Psychiatric: Mood and affect normal.  Nursing note and vitals reviewed.   Results for orders placed or performed during the hospital encounter of 06/03/17 (from the past 48 hour(s))  CBC with Differential     Status: Abnormal   Collection Time: 06/03/17  3:31 PM  Result Value Ref Range   WBC 12.2 (H) 4.0 - 10.5 K/uL   RBC 4.36 3.87 - 5.11 MIL/uL   Hemoglobin 12.6 12.0 - 15.0 g/dL   HCT 38.3 36.0 - 46.0 %   MCV 87.8 78.0 - 100.0 fL   MCH 28.9 26.0 - 34.0 pg   MCHC 32.9 30.0 - 36.0 g/dL   RDW 13.4 11.5 - 15.5 %   Platelets 380 150 - 400 K/uL   Neutrophils Relative % 78 %   Neutro Abs 9.4 (H) 1.7 - 7.7 K/uL   Lymphocytes Relative 15 %   Lymphs Abs 1.9 0.7 - 4.0 K/uL   Monocytes Relative 7 %   Monocytes Absolute 0.8 0.1 - 1.0 K/uL   Eosinophils Relative 0 %   Eosinophils Absolute 0.1 0.0 - 0.7 K/uL   Basophils Relative 0 %   Basophils Absolute 0.0 0.0 - 0.1 K/uL   No results found.    Assessment/Plan Right buttock, left groin and pilonidal abscess - admit to [facility] - CT pelvis pending - IV Zosyn - NPO after midnight and likely surgery tomorrow morning pending CT scan results  FEN: reg diet, NPO after midnight ID: Zosyn 07/30>> VTE: SCD's  Plan: likely OR tomorrow pending CT scan results   Kalman Drape, [facility] Surgery 06/03/2017, 4:20 PM Pager: [phone] Consults: [phone] Mon-Fri 7:00 am-4:30 pm Sat-Sun 7:00 am-11:30 am

## 2017-06-03 NOTE — ED Provider Notes (Signed)
[place] DEPT Provider Note   CSN: [id number] Arrival date & time: 06/03/17  0920   By signing my name below, I, Soijett Blue, attest that this documentation has been prepared under the direction and in the presence of Lenn Sink, PA-C Electronically Signed: [place], ED Scribe. 06/03/17. 10:55 AM.  History   Chief Complaint Chief Complaint  Patient presents with  . Cyst on tailbone    HPI  Sharon Bell is a 46 y.o. female who presents to the Emergency Department complaining of possible cyst on tailbone onset 3 months ago. Pt reports associated multiple abscesses to her genital region. Pt has not tried any medications for the relief of her symptoms. She notes that she has a PMHx of a cyst that was surgically I&D when she was 46 years old. Pt reports that she has had pilonidal cyst since her surgery that resolved on their own. Pt states that she also has cysts to her posterior knees and has been evaluated at Urgent Care where she was treated with abx. She denies fever, chills, drainage, color change, and any other symptoms.   The history is provided by the patient. No language interpreter was used.    Past Medical History:  Diagnosis Date  . Fibroids 2009    Patient Active Problem List   Diagnosis Date Noted  . Perirectal abscess 06/03/2017  . Fibroids 02/24/2013    Past Surgical History:  Procedure Laterality Date  . Bilateral repair of hammertoe     4th toe of both feet  . DILATION AND EVACUATION  04/18/2015   Procedure: DILATATION AND EVACUATION;  Surgeon: Alden Hipp, MD;  Location: [facility];  Service: Gynecology;;  . HYSTEROSCOPY WITH NOVASURE N/A 04/18/2015   Procedure: HYSTEROSCOPY WITH RESECTION OF ENDOMETRIAL POLYP using resectoscope, suction dilatation and curratage;  Surgeon: Alden Hipp, MD;  Location: [facility];  Service: Gynecology;  Laterality: N/A;  RESECTOSCOPE AVAILABLE  . polyknital   1990   polyknital cyst excision   . UTERINE  FIBROID EMBOLIZATION  02/23/2013    OB History    No data available       Home Medications    Prior to Admission medications   Medication Sig Start Date End Date Taking? Authorizing Provider  norgestrel-ethinyl estradiol (CRYSELLE-28) 0.3-30 MG-MCG tablet Take 1 tablet by mouth daily.   Yes [provider]    Family History No family history on file.  Social History Social History  Substance Use Topics  . Smoking status: Never Smoker  . Smokeless tobacco: Never Used  . Alcohol use Yes     Comment: Occasional glass of wine     Allergies   Patient has no known allergies.   Review of Systems Review of Systems  Constitutional: Negative for chills and fever.  Skin: Negative for color change.       +pilonidial cyst without drainage.  +multiple abscesses to genital region without drainage  All other systems reviewed and are negative.   Physical Exam Updated Vital Signs BP 127/86 (BP Location: Right Arm)   Pulse 92   Temp 97.8 F (36.6 C) (Oral)   Resp 17   Ht 5\' 9"  (1.753 m)   Wt 193 lb (87.5 kg)   LMP 05/27/2017   SpO2 100%   BMI 28.50 kg/m   Physical Exam  Constitutional: She is oriented to person, place, and time. She appears well-developed and well-nourished. No distress.  HENT:  Head: Normocephalic and atraumatic.  Eyes: EOM are normal.  Neck: Neck  supple.  Cardiovascular: Normal rate.   Pulmonary/Chest: Effort normal. No respiratory distress.  Abdominal: She exhibits no distension.  Genitourinary:  Genitourinary Comments: Scribe chaperone present for exam. 1.5 cm cyst to the right lateral sacrum and upper gluteus. No surrounding redness. Fluctuance noted. 3 separate abscesses in the groin and peri-rectal region, most notably one perirectal abscess extending to the anus.  Musculoskeletal: Normal range of motion.  Neurological: She is alert and oriented to person, place, and time.  Skin: Skin is warm and dry.  Psychiatric: She has a normal  mood and affect. Her behavior is normal.  Nursing note and vitals reviewed.    ED Treatments / Results  DIAGNOSTIC STUDIES: Oxygen Saturation is 100% on RA, nl by my interpretation.    COORDINATION OF CARE: 10:53 AM Discussed treatment plan with pt at bedside and pt agreed to plan.   Labs (all labs ordered are listed, but only abnormal results are displayed) Labs Reviewed  CBC WITH DIFFERENTIAL/PLATELET - Abnormal; Notable for the following:       Result Value   WBC 12.2 (*)    Neutro Abs 9.4 (*)    All other components within normal limits  BASIC METABOLIC PANEL - Abnormal; Notable for the following:    Potassium 3.3 (*)    Calcium 8.5 (*)    All other components within normal limits  HIV ANTIBODY (ROUTINE TESTING)    EKG  EKG Interpretation None       Radiology Ct Abdomen Pelvis W Contrast  Result Date: 06/03/2017 CLINICAL DATA:  Pelvic abscesses status post drainage EXAM: CT ABDOMEN AND PELVIS WITH CONTRAST TECHNIQUE: Multidetector CT imaging of the abdomen and pelvis was performed using the standard protocol following bolus administration of intravenous contrast. CONTRAST:  169mL ISOVUE-300 IOPAMIDOL (ISOVUE-300) INJECTION 61% COMPARISON:  [facility] pelvic MRI dated 09/07/2013 FINDINGS: Lower chest: Lung bases are clear. Hepatobiliary: Liver is within normal limits. 1.7 cm gallstone (series 2/ image 32), without associated inflammatory changes. Pancreas: Within normal limits. Spleen: Within normal limits. Adrenals/Urinary Tract: Adrenal glands within normal limits. Kidneys are within normal limits.  No hydronephrosis. Bladder is underdistended but unremarkable. Stomach/Bowel: Stomach is within normal limits. No evidence of bowel obstruction. Normal appendix (series 2/image 66). No colonic wall thickening or inflammatory changes. Vascular/Lymphatic: No evidence of abdominal aortic aneurysm. No suspicious abdominopelvic lymphadenopathy. Reproductive: Uterine fibroids,  including a dominant 8.8 cm intramural fibroid in the lower uterine body, unchanged from prior MRI. No adnexal masses. Other: No abdominopelvic ascites. No evidence of perirectal abscess. No fluid collection in the buttock/gluteal region. Musculoskeletal: Visualized osseous structures are within normal limits. IMPRESSION: No evidence of perirectal abscess. Cholelithiasis, without associated inflammatory changes. Uterine fibroids, including a dominant 8.8 cm intramural fibroid in the lower uterine body, unchanged from prior MRI. Electronically Signed   By: Julian Hy M.D.   On: 06/03/2017 17:31    Procedures .Marland KitchenIncision and Drainage Date/Time: 06/03/2017 12:49 PM Performed by: Penni Bombard, Kamarie Veno Authorized by: Penni Bombard, Arayna Illescas   Consent:    Consent obtained:  Verbal   Consent given by:  Patient   Risks discussed:  Pain and infection Location:    Type:  Abscess   Size:  1.5 cm   Location:  Anogenital   Anogenital location: right lateral sacrum and upper gluteus. Pre-procedure details:    Skin preparation:  Betadine Anesthesia (see MAR for exact dosages):    Anesthesia method:  Local infiltration   Local anesthetic:  Lidocaine 2% WITH epi (3 cc used) Procedure type:  Complexity:  Simple Procedure details:    Needle aspiration: no     Incision types:  Stab incision   Incision depth:  Dermal   Scalpel blade:  11   Wound management:  Probed and deloculated, irrigated with saline and extensive cleaning   Drainage:  Serous   Drainage amount:  Moderate   Wound treatment:  Wound left open   Packing materials:  None Post-procedure details:    Patient tolerance of procedure:  Tolerated well, no immediate complications [place] bedside Date/Time: 06/03/2017 12:48 PM Performed by: Penni Bombard, Sandara Tyree Authorized by: Penni Bombard, Ahaan Zobrist  Consent: Verbal consent obtained. Risks and benefits: risks, benefits and alternatives were discussed Consent given by: patient Patient understanding: patient states  understanding of the procedure being performed Patient consent: the patient's understanding of the procedure matches consent given Relevant documents: relevant documents present and verified Test results: test results available and properly labeled Site marked: the operative site was marked Imaging studies: imaging studies not available Patient identity confirmed: verbally with patient, arm band and provided demographic data Time out: Immediately prior to procedure a "time out" was called to verify the correct patient, procedure, equipment, support staff and site/side marked as required. Preparation: Patient was prepped and draped in the usual sterile fashion. Local anesthesia used: no  Anesthesia: Local anesthesia used: no  Sedation: Patient sedated: no Patient tolerance: Patient tolerated the procedure well with no immediate complications    (including critical care time)  Medications Ordered in ED Medications  piperacillin-tazobactam (ZOSYN) IVPB 3.375 g (not administered)  0.9 %  sodium chloride infusion ( Intravenous New Bag/Given 06/03/17 1658)  oxyCODONE (Oxy IR/ROXICODONE) immediate release tablet 5-10 mg (not administered)  morphine 2 MG/ML injection 2 mg (not administered)  diphenhydrAMINE (BENADRYL) capsule 25 mg (not administered)    Or  diphenhydrAMINE (BENADRYL) injection 25 mg (not administered)  ondansetron (ZOFRAN-ODT) disintegrating tablet 4 mg (not administered)    Or  ondansetron (ZOFRAN) injection 4 mg (not administered)  hydrALAZINE (APRESOLINE) injection 10 mg (not administered)  iopamidol (ISOVUE-300) 61 % injection (not administered)  lidocaine-EPINEPHrine (XYLOCAINE W/EPI) 2 %-1:100000 (with pres) injection 20 mL (20 mLs Infiltration Given by Other 06/03/17 1218)  oxyCODONE-acetaminophen (PERCOCET/ROXICET) 5-325 MG per tablet 1 tablet (1 tablet Oral Given 06/03/17 1343)  piperacillin-tazobactam (ZOSYN) IVPB 3.375 g (3.375 g Intravenous New Bag/Given 06/03/17  1658)  iopamidol (ISOVUE-300) 61 % injection 100 mL (100 mLs Intravenous Contrast Given 06/03/17 1715)     Initial Impression / Assessment and Plan / ED Course  I have reviewed the triage vital signs and the nursing notes.  Pertinent labs & imaging results that were available during my care of the patient were reviewed by me and considered in my medical decision making (see chart for details).      46 year old female presents today with numerous abscesses.  He was able to drain the pilonidal abscess, patient was unable to tolerate any further anesthetics in the perirectal region.  She does have a perirectal abscess that needs further evaluation and management.  General surgery will be consulted given patient's presentation and inability to tolerate procedures here in the ED.  Final Clinical Impressions(s) / ED Diagnoses   Final diagnoses:  Abscess    New Prescriptions New Prescriptions   No medications on file   I personally performed the services described in this documentation, which was scribed in my presence. The recorded information has been reviewed and is accurate.   Okey Regal, PA-C 06/03/17 1734    Gareth Morgan, MD  06/04/17 0843  

## 2017-06-03 NOTE — ED Notes (Signed)
PA at bedside.

## 2017-06-04 ENCOUNTER — Observation Stay (HOSPITAL_COMMUNITY): Payer: BLUE CROSS/BLUE SHIELD | Admitting: Anesthesiology

## 2017-06-04 ENCOUNTER — Encounter (HOSPITAL_COMMUNITY): Payer: Self-pay

## 2017-06-04 ENCOUNTER — Encounter (HOSPITAL_COMMUNITY)
Admission: EM | Disposition: A | Discharge status: Home / Self Care | Payer: Self-pay | Source: Home / Self Care | Attending: Emergency Medicine

## 2017-06-04 HISTORY — PX: INCISION AND DRAINAGE PERIRECTAL ABSCESS: SHX1804

## 2017-06-04 LAB — PREGNANCY, URINE: Preg Test, Ur: NEGATIVE

## 2017-06-04 LAB — HIV ANTIBODY (ROUTINE TESTING W REFLEX): HIV Screen 4th Generation wRfx: NONREACTIVE

## 2017-06-04 LAB — MRSA PCR SCREENING: MRSA BY PCR: NEGATIVE

## 2017-06-04 SURGERY — INCISION AND DRAINAGE, ABSCESS, PERIRECTAL
Anesthesia: General | Site: Perineum

## 2017-06-04 MED ORDER — BUPIVACAINE HCL (PF) 0.5 % IJ SOLN
INTRAMUSCULAR | Status: AC
  Filled 2017-06-04: qty 30

## 2017-06-04 MED ORDER — BUPIVACAINE HCL 0.25 % IJ SOLN
INTRAMUSCULAR | Status: AC
  Filled 2017-06-04: qty 1

## 2017-06-04 MED ORDER — LACTATED RINGERS IV SOLN
INTRAVENOUS | Status: DC | PRN
  Administered 2017-06-04: 10:00:00 via INTRAVENOUS

## 2017-06-04 MED ORDER — 0.9 % SODIUM CHLORIDE (POUR BTL) OPTIME
TOPICAL | Status: DC | PRN
  Administered 2017-06-04: 1000 mL

## 2017-06-04 MED ORDER — MIDAZOLAM HCL 5 MG/5ML IJ SOLN
INTRAMUSCULAR | Status: DC | PRN
  Administered 2017-06-04: 2 mg via INTRAVENOUS

## 2017-06-04 MED ORDER — LIDOCAINE 2% (20 MG/ML) 5 ML SYRINGE
INTRAMUSCULAR | Status: AC
  Filled 2017-06-04: qty 5

## 2017-06-04 MED ORDER — HYDROMORPHONE HCL-NACL 0.5-0.9 MG/ML-% IV SOSY
PREFILLED_SYRINGE | INTRAVENOUS | Status: AC
  Administered 2017-06-04: 0.5 mg via INTRAVENOUS
  Filled 2017-06-04: qty 2

## 2017-06-04 MED ORDER — HYDROMORPHONE HCL-NACL 0.5-0.9 MG/ML-% IV SOSY: 1.0000 mg | PREFILLED_SYRINGE | INTRAVENOUS | Status: DC | PRN

## 2017-06-04 MED ORDER — KCL IN DEXTROSE-NACL 20-5-0.45 MEQ/L-%-% IV SOLN
INTRAVENOUS | Status: DC
  Administered 2017-06-04: 14:00:00 via INTRAVENOUS
  Filled 2017-06-04 (×2): qty 1000

## 2017-06-04 MED ORDER — FENTANYL CITRATE (PF) 100 MCG/2ML IJ SOLN
INTRAMUSCULAR | Status: DC | PRN
  Administered 2017-06-04 (×3): 50 ug via INTRAVENOUS

## 2017-06-04 MED ORDER — DEXAMETHASONE SODIUM PHOSPHATE 10 MG/ML IJ SOLN
INTRAMUSCULAR | Status: DC | PRN
  Administered 2017-06-04: 10 mg via INTRAVENOUS

## 2017-06-04 MED ORDER — PROPOFOL 10 MG/ML IV BOLUS
INTRAVENOUS | Status: AC
  Filled 2017-06-04: qty 20

## 2017-06-04 MED ORDER — PROMETHAZINE HCL 25 MG/ML IJ SOLN: 6.2500 mg | INTRAMUSCULAR | Status: DC | PRN

## 2017-06-04 MED ORDER — HYDROMORPHONE HCL-NACL 0.5-0.9 MG/ML-% IV SOSY
PREFILLED_SYRINGE | INTRAVENOUS | Status: AC
  Filled 2017-06-04: qty 1

## 2017-06-04 MED ORDER — LIDOCAINE HCL (CARDIAC) 20 MG/ML IV SOLN
INTRAVENOUS | Status: DC | PRN
  Administered 2017-06-04: 50 mg via INTRAVENOUS

## 2017-06-04 MED ORDER — PIPERACILLIN-TAZOBACTAM 3.375 G IVPB
3.3750 g | Freq: Three times a day (TID) | INTRAVENOUS | Status: DC
  Administered 2017-06-04 – 2017-06-05 (×3): 3.375 g via INTRAVENOUS
  Filled 2017-06-04 (×2): qty 50

## 2017-06-04 MED ORDER — ONDANSETRON HCL 4 MG/2ML IJ SOLN
4.0000 mg | Freq: Four times a day (QID) | INTRAMUSCULAR | Status: DC | PRN
  Administered 2017-06-04 (×2): 4 mg via INTRAVENOUS
  Filled 2017-06-04 (×2): qty 2

## 2017-06-04 MED ORDER — BUPIVACAINE HCL 0.25 % IJ SOLN
INTRAMUSCULAR | Status: DC | PRN
  Administered 2017-06-04: 20 mL

## 2017-06-04 MED ORDER — HYDROMORPHONE HCL-NACL 0.5-0.9 MG/ML-% IV SOSY
0.2500 mg | PREFILLED_SYRINGE | INTRAVENOUS | Status: DC | PRN
  Administered 2017-06-04 (×3): 0.5 mg via INTRAVENOUS

## 2017-06-04 MED ORDER — HYDROCODONE-ACETAMINOPHEN 5-325 MG PO TABS
1.0000 | ORAL_TABLET | ORAL | Status: DC | PRN
  Administered 2017-06-05: 1 via ORAL
  Filled 2017-06-04: qty 1

## 2017-06-04 MED ORDER — PROPOFOL 10 MG/ML IV BOLUS
INTRAVENOUS | Status: DC | PRN
Start: 2017-06-04 — End: 2017-06-04
  Administered 2017-06-04: 140 mg via INTRAVENOUS

## 2017-06-04 MED ORDER — MIDAZOLAM HCL 2 MG/2ML IJ SOLN
INTRAMUSCULAR | Status: AC
  Filled 2017-06-04: qty 2

## 2017-06-04 MED ORDER — SUCCINYLCHOLINE CHLORIDE 200 MG/10ML IV SOSY
PREFILLED_SYRINGE | INTRAVENOUS | Status: AC
  Filled 2017-06-04: qty 10

## 2017-06-04 MED ORDER — ACETAMINOPHEN 325 MG PO TABS: 650.0000 mg | ORAL_TABLET | Freq: Four times a day (QID) | ORAL | Status: DC | PRN

## 2017-06-04 MED ORDER — DEXAMETHASONE SODIUM PHOSPHATE 10 MG/ML IJ SOLN
INTRAMUSCULAR | Status: AC
  Filled 2017-06-04: qty 1

## 2017-06-04 MED ORDER — ONDANSETRON HCL 4 MG/2ML IJ SOLN
INTRAMUSCULAR | Status: DC | PRN
  Administered 2017-06-04: 4 mg via INTRAVENOUS

## 2017-06-04 MED ORDER — ACETAMINOPHEN 650 MG RE SUPP: 650.0000 mg | Freq: Four times a day (QID) | RECTAL | Status: DC | PRN

## 2017-06-04 MED ORDER — FENTANYL CITRATE (PF) 250 MCG/5ML IJ SOLN
INTRAMUSCULAR | Status: AC
  Filled 2017-06-04: qty 5

## 2017-06-04 MED ORDER — ONDANSETRON 4 MG PO TBDP: 4.0000 mg | ORAL_TABLET | Freq: Four times a day (QID) | ORAL | Status: DC | PRN

## 2017-06-04 MED ORDER — ONDANSETRON HCL 4 MG/2ML IJ SOLN
INTRAMUSCULAR | Status: AC
  Filled 2017-06-04: qty 2

## 2017-06-04 SURGICAL SUPPLY — 23 items
BLADE SURG 15 STRL LF DISP TIS (BLADE) ×1 IMPLANT
BLADE SURG 15 STRL SS (BLADE) ×2
BRIEF STRETCH FOR OB PAD LRG (UNDERPADS AND DIAPERS) ×3 IMPLANT
COVER SURGICAL LIGHT HANDLE (MISCELLANEOUS) ×3 IMPLANT
DRAIN PENROSE 18X1/2 LTX STRL (DRAIN) IMPLANT
DRSG PAD ABDOMINAL 8X10 ST (GAUZE/BANDAGES/DRESSINGS) IMPLANT
ELECT PENCIL ROCKER SW 15FT (MISCELLANEOUS) ×3 IMPLANT
ELECT REM PT RETURN 15FT ADLT (MISCELLANEOUS) ×3 IMPLANT
GAUZE SPONGE 4X4 12PLY STRL (GAUZE/BANDAGES/DRESSINGS) ×6 IMPLANT
GLOVE SURG ORTHO 8.0 STRL STRW (GLOVE) ×3 IMPLANT
GOWN STRL REUS W/TWL XL LVL3 (GOWN DISPOSABLE) ×6 IMPLANT
KIT BASIN OR (CUSTOM PROCEDURE TRAY) ×3 IMPLANT
LUBRICANT JELLY K Y 4OZ (MISCELLANEOUS) ×3 IMPLANT
PACK LITHOTOMY IV (CUSTOM PROCEDURE TRAY) ×3 IMPLANT
PACKING VAGINAL (PACKING) IMPLANT
SPONGE LAP 18X18 X RAY DECT (DISPOSABLE) ×3 IMPLANT
SUT ETHILON 2 0 PS N (SUTURE) IMPLANT
SWAB COLLECTION DEVICE MRSA (MISCELLANEOUS) IMPLANT
SWAB CULTURE ESWAB REG 1ML (MISCELLANEOUS) IMPLANT
TOWEL OR 17X26 10 PK STRL BLUE (TOWEL DISPOSABLE) ×3 IMPLANT
TOWEL OR NON WOVEN STRL DISP B (DISPOSABLE) ×3 IMPLANT
UNDERPAD 30X30 (UNDERPADS AND DIAPERS) ×3 IMPLANT
YANKAUER SUCT BULB TIP 10FT TU (MISCELLANEOUS) ×3 IMPLANT

## 2017-06-04 NOTE — Op Note (Signed)
Sharon Bell, Sharon Bell    ACCOUNT NO.:  [id number]  MEDICAL RECORD NO.:  [id number]  LOCATION:  [facility]05                         FACILITY:  [facility]  PHYSICIAN:  Earnstine Regal, MD      DATE OF BIRTH:  04-23-1971  DATE OF PROCEDURE:  06/04/2017                              OPERATIVE REPORT   PREOPERATIVE DIAGNOSES: 1. Right perirectal abscess. 2. Left labial abscess.  POSTOPERATIVE DIAGNOSES: 1. Right perirectal abscess. 2. Left labial abscess.  PROCEDURES: 1. Incision, drainage and debridement of right perirectal abscess (4 x     5 x 1 cm). 2. Incision, drainage and debridement of left labial abscess (1 x 1 x     4 cm).  SURGEON:  Earnstine Regal, MD  ANESTHESIA:  General.  ESTIMATED BLOOD LOSS:  Minimal.  PREPARATION:  Betadine.  COMPLICATIONS:  None.  INDICATIONS:  The patient is a 46 year old female, presented to the emergency department with multiple abscesses.  She underwent incision and drainage of a recurrent pilonidal cyst by the emergency room staff. The patient was noted to have a left labial abscess and a right perirectal abscess.  General Surgery was consulted.  White blood cell count was elevated at 12.2.  Given the complex nature of multiple abscesses, a CT scan of the pelvis was obtained, which failed to reveal any intrapelvic pathology.  The patient is now prepared and brought to the operating room for incision and drainage.  BODY OF REPORT:  Procedure was done in OR #4 at the [facility].  The patient was brought to the operating room, placed in supine position on the operating room table.  Following administration of general anesthesia, the patient was placed in lithotomy, and prepped and draped in the usual aseptic fashion.  After ascertaining that an adequate level of anesthesia had been achieved, the left labial abscess was addressed.  An incision was made into the roof of the abscess and pink purulent fluid was evacuated.   There was chronic granulation tissue consistent with hidradenitis.  Using a blunt probe, the abscess cavity tracked anteriorly.  It was opened along its extent and the overlying skin was excised.  Cavity was debrided and hemostasis achieved with the electrocautery.  Wound was packed with coarse mesh gauze, saturated with Betadine.  The entire cavity measured 1 x 1 x 4 cm in size.  Next, we addressed the right perirectal abscesses.  There were two fistulous openings draining pink purulent fluid from the medial aspect of the right buttock.  These fistulous openings were connected and an ellipse of skin was excised undermining the skin in several directions for abscess cavities.  These were all relatively superficial within the subcutaneous tissues, but no deep space infection was noted.  Abscess cavities were unroofed.  They were debrided.  They were irrigated. Hemostasis was achieved with the electrocautery.  The entire defect measured 5 x 4 x 1 cm in size.  Betadine-soaked gauze packing was placed.  Local anesthetic was infiltrated circumferentially around both wounds.  Dry gauze dressings were placed.  The patient was taken out of lithotomy and awakened from anesthesia and brought to the recovery room. The patient tolerated the procedure well.   Earnstine Regal, MD, [facility] Office: [phone]    TMG/MEDQ  D:  06/04/2017  T:  06/04/2017  Job:  [id number]

## 2017-06-04 NOTE — Transfer of Care (Signed)
Immediate Anesthesia Transfer of Care Note  Patient: Sharon Bell  Procedure(s) Performed: Procedure(s): IRRIGATION AND DEBRIDEMENT RIGHT PERIRECTAL ABSCESS AND LEFT LABIAL ABCESS (N/A)  Patient Location: PACU  Anesthesia Type:General  Level of Consciousness:  sedated, patient cooperative and responds to stimulation  Airway & Oxygen Therapy:Patient Spontanous Breathing and Patient connected to face mask oxgen  Post-op Assessment:  Report given to PACU RN and Post -op Vital signs reviewed and stable  Post vital signs:  Reviewed and stable  Last Vitals:  Vitals:   06/04/17 0541 06/04/17 0700  BP: (!) 83/48 95/63  Pulse: 88 85  Resp: 15   Temp: 75.3 C     Complications: No apparent anesthesia complications

## 2017-06-04 NOTE — Interval H&P Note (Signed)
History and Physical Interval Note:  06/04/2017 10:48 AM  Sharon Bell  has presented today for surgery, with the diagnosis of right perirectal and left labial abcess  The various methods of treatment have been discussed with the patient and family. After consideration of risks, benefits and other options for treatment, the patient has consented to    Procedure(s): IRRIGATION AND DEBRIDEMENT RIGHT PERIRECTAL ABSCESS AND LEFT LABIAL ABCESS (N/A) as a surgical intervention .    The patient's history has been reviewed, patient examined, no change in status, stable for surgery.  I have reviewed the patient's chart and labs.  Questions were answered to the patient's satisfaction.    Earnstine Regal, MD, [facility] Office: [place]

## 2017-06-04 NOTE — Brief Op Note (Signed)
06/03/2017 - 06/04/2017  11:31 AM  PATIENT:  Sharon Bell  46 y.o. female  PRE-OPERATIVE DIAGNOSIS:  right perirectal and left labial abcess  POST-OPERATIVE DIAGNOSIS:  right perirectal and left labial abcess  PROCEDURE:  Procedure(s): IRRIGATION AND DEBRIDEMENT RIGHT PERIRECTAL ABSCESS AND LEFT LABIAL ABCESS (N/A)  SURGEON:  Surgeon(s) and Role:    Armandina Gemma, MD - Primary  ANESTHESIA:   general  EBL:  Total I/O In: -  Out: 10 [Blood:10]  BLOOD ADMINISTERED:none  DRAINS: none   LOCAL MEDICATIONS USED:  MARCAINE     SPECIMEN:  No Specimen  DISPOSITION OF SPECIMEN:  N/A  COUNTS:  YES  TOURNIQUET:  * No tourniquets in log *  DICTATION: .Other Dictation: Dictation Number [id number]  PLAN OF CARE: Other Dictation: Dictation Number [id number]  PATIENT DISPOSITION:  PACU - hemodynamically stable.   Delay start of Pharmacological VTE agent (>24hrs) due to surgical blood loss or risk of bleeding: yes  Earnstine Regal, MD, [facility] Office: [phone]

## 2017-06-04 NOTE — Anesthesia Procedure Notes (Signed)
Procedure Name: LMA Insertion Date/Time: 06/04/2017 10:58 AM Performed by: Glory Buff Pre-anesthesia Checklist: Emergency Drugs available, Patient identified, Suction available and Patient being monitored Patient Re-evaluated:Patient Re-evaluated prior to induction Oxygen Delivery Method: Circle system utilized Preoxygenation: Pre-oxygenation with 100% oxygen Induction Type: IV induction LMA: LMA inserted LMA Size: 4.0 Number of attempts: 1 Placement Confirmation: positive ETCO2 Tube secured with: Tape Dental Injury: Teeth and Oropharynx as per pre-operative assessment

## 2017-06-04 NOTE — Anesthesia Preprocedure Evaluation (Addendum)
Anesthesia Evaluation  Patient identified by MRN, date of birth, ID band Patient awake    Reviewed: Allergy & Precautions, NPO status , Patient's Chart, lab work & pertinent test results  Airway Mallampati: II  TM Distance: >3 FB Neck ROM: Full    Dental  (+) Dental Advisory Given   Pulmonary neg pulmonary ROS,    breath sounds clear to auscultation       Cardiovascular negative cardio ROS   Rhythm:Regular Rate:Normal     Neuro/Psych negative neurological ROS     GI/Hepatic Neg liver ROS, Peri rectal abscess   Endo/Other  negative endocrine ROS  Renal/GU negative Renal ROS     Musculoskeletal negative musculoskeletal ROS (+)   Abdominal   Peds  Hematology negative hematology ROS (+)   Anesthesia Other Findings   Reproductive/Obstetrics                            Lab Results  Component Value Date   WBC 12.2 (H) 06/03/2017   HGB 12.6 06/03/2017   HCT 38.3 06/03/2017   MCV 87.8 06/03/2017   PLT 380 06/03/2017   Lab Results  Component Value Date   CREATININE 0.79 06/03/2017   BUN 9 06/03/2017   NA 136 06/03/2017   K 3.3 (L) 06/03/2017   CL 104 06/03/2017   CO2 26 06/03/2017    Anesthesia Physical Anesthesia Plan  ASA: II  Anesthesia Plan: General   Post-op Pain Management:    Induction: Intravenous  PONV Risk Score and Plan: 3 and Ondansetron, Dexamethasone, Midazolam and Treatment may vary due to age or medical condition  Airway Management Planned: Oral ETT and LMA  Additional Equipment:   Intra-op Plan:   Post-operative Plan: Extubation in OR  Informed Consent: I have reviewed the patients History and Physical, chart, labs and discussed the procedure including the risks, benefits and alternatives for the proposed anesthesia with the patient or authorized representative who has indicated his/her understanding and acceptance.   Dental advisory given  Plan  Discussed with: CRNA  Anesthesia Plan Comments:        Anesthesia Quick Evaluation

## 2017-06-04 NOTE — Anesthesia Postprocedure Evaluation (Signed)
Anesthesia Post Note  Patient: Sharon Bell  Procedure(s) Performed: Procedure(s) (LRB): IRRIGATION AND DEBRIDEMENT RIGHT PERIRECTAL ABSCESS AND LEFT LABIAL ABCESS (N/A)     Patient location during evaluation: PACU Anesthesia Type: General Level of consciousness: awake and alert Pain management: pain level controlled Vital Signs Assessment: post-procedure vital signs reviewed and stable Respiratory status: spontaneous breathing, nonlabored ventilation, respiratory function stable and patient connected to nasal cannula oxygen Cardiovascular status: blood pressure returned to baseline and stable Postop Assessment: no signs of nausea or vomiting Anesthetic complications: no    Last Vitals:  Vitals:   06/04/17 1250 06/04/17 1351  BP: 100/68 97/65  Pulse: 67 68  Resp: 16 16  Temp: 37.2 C 37.2 C    Last Pain:  Vitals:   06/04/17 1351  TempSrc: Oral  PainSc:                  Tiajuana Amass

## 2017-06-05 ENCOUNTER — Encounter (HOSPITAL_COMMUNITY): Payer: Self-pay | Admitting: Surgery

## 2017-06-05 MED ORDER — AMOXICILLIN-POT CLAVULANATE 875-125 MG PO TABS: 1.0000 | ORAL_TABLET | Freq: Two times a day (BID) | ORAL | 0 refills | Status: AC

## 2017-06-05 MED ORDER — HYDROCODONE-ACETAMINOPHEN 5-325 MG PO TABS: 1.0000 | ORAL_TABLET | ORAL | 0 refills | Status: DC | PRN

## 2017-06-05 NOTE — Discharge Summary (Signed)
[facility]/Trauma Discharge Summary   Patient ID: Sharon Bell MRN: [id number] DOB/AGE: 1971/06/27 46 y.o.  Admit date: 06/03/2017 Discharge date: 06/05/2017  Admitting Diagnosis: Right perirectal abscess Left labial abscess  Discharge Diagnosis Patient Active Problem List   Diagnosis Date Noted  . Perirectal abscess 06/03/2017  . Fibroids 02/24/2013    Consultants none  Imaging: Ct Abdomen Pelvis W Contrast  Result Date: 06/03/2017 CLINICAL DATA:  Pelvic abscesses status post drainage EXAM: CT ABDOMEN AND PELVIS WITH CONTRAST TECHNIQUE: Multidetector CT imaging of the abdomen and pelvis was performed using the standard protocol following bolus administration of intravenous contrast. CONTRAST:  160mL ISOVUE-300 IOPAMIDOL (ISOVUE-300) INJECTION 61% COMPARISON:  [facility] pelvic MRI dated 09/07/2013 FINDINGS: Lower chest: Lung bases are clear. Hepatobiliary: Liver is within normal limits. 1.7 cm gallstone (series 2/ image 32), without associated inflammatory changes. Pancreas: Within normal limits. Spleen: Within normal limits. Adrenals/Urinary Tract: Adrenal glands within normal limits. Kidneys are within normal limits.  No hydronephrosis. Bladder is underdistended but unremarkable. Stomach/Bowel: Stomach is within normal limits. No evidence of bowel obstruction. Normal appendix (series 2/image 66). No colonic wall thickening or inflammatory changes. Vascular/Lymphatic: No evidence of abdominal aortic aneurysm. No suspicious abdominopelvic lymphadenopathy. Reproductive: Uterine fibroids, including a dominant 8.8 cm intramural fibroid in the lower uterine body, unchanged from prior MRI. No adnexal masses. Other: No abdominopelvic ascites. No evidence of perirectal abscess. No fluid collection in the buttock/gluteal region. Musculoskeletal: Visualized osseous structures are within normal limits. IMPRESSION: No evidence of perirectal abscess. Cholelithiasis, without  associated inflammatory changes. Uterine fibroids, including a dominant 8.8 cm intramural fibroid in the lower uterine body, unchanged from prior MRI. Electronically Signed   By: Julian Hy M.D.   On: 06/03/2017 17:31    Procedures Dr. Harlow Asa (06/04/17) - Incision, drainage and debridement of right perirectal abscess (4 x 5 x 1 cm). Incision, drainage and debridement of left labial abscess (1 x 1 x 4 cm).  Hospital Course:  Sharon Bell is an otherwise healthy 46 year old female who presented to [facility] with pain in buttock and labial region.  Workup showed perirectal and left labial abscesses. Small pilonidal abscess was drained by EDP. Patient was admitted and underwent procedure listed above.  Tolerated procedure well and was transferred to the floor.  Diet was advanced as tolerated.  On POD#1, the patient was voiding well, ambulating well, pain well controlled, vital signs stable, wounds c/d/i and felt stable for discharge home.  Patient will follow up in our office in 1-2 weeks and knows to call with questions or concerns.  She will call to confirm appointment date/time.    Patient was discharged in good condition.  The [place] Substance controlled database was reviewed prior to prescribing narcotic pain medication to this patient.  Physical Exam: General:  Alert, NAD, pleasant, cooperative, well appearing Resp: rate and effort Skin: warm and dry GU: left labial wound packing removed and wound is without purulent drainage, no bleeding, wound appears well. Right perirectal wound slightly larger with packing removed and without purulent drainage or bleeding. Wound appears well. Mild TTP.   Allergies as of 06/05/2017   No Known Allergies     Medication List    TAKE these medications   amoxicillin-clavulanate 875-125 MG tablet Commonly known as:  AUGMENTIN Take 1 tablet by mouth every 12 (twelve) hours.   CRYSELLE-28 0.3-30 MG-MCG tablet Generic drug:   norgestrel-ethinyl estradiol Take 1 tablet by mouth daily.   HYDROcodone-acetaminophen 5-325 MG tablet Commonly  known as:  NORCO/VICODIN Take 1-2 tablets by mouth every 4 (four) hours as needed for moderate pain (before dressing changes).        Follow-up [facility], [place]. Call.   Specialty:  General Surgery Why:  an appointment has been made for you. Please call to confirm time and date Contact information: [address] [place] [phone]          Signed: [facility] 06/05/2017, 10:06 AM Pager: [phone] Consults: [phone] Mon-Fri 7:00 am-4:30 pm Sat-Sun 7:00 am-11:30 am

## 2017-06-05 NOTE — Progress Notes (Addendum)
Patient ready to be discharged.  Discharge instructions reviewed with patients and prescriptions given to patient.  Patient and family verbalized understanding.  IV removed and patient discharged via wheelchair. Patient given sitz bath, NS, gauze, mesh underwear, to use for dressing changes.

## 2017-06-05 NOTE — Discharge Instructions (Signed)
Start the Augmentin (antibiotic) tonight 06/05/17.   WOUND CARE: - dressing to be changed twice daily - supplies: sterile saline, 4x4 or 2x2 gauze, ABD pads - remove dressing and all packing carefully, moistening with sterile saline as needed to avoid packing/internal dressing sticking to the wound. - clean edges of skin around the wound with water/gauze, making sure there is no tape debris or leakage left on skin that could cause skin irritation or breakdown. It would be best to soak the wounds after removing the packing before repacking. (see below for sitz bath instructions) - dampen and clean gauze with sterile saline and pack wound from wound base to skin level, making sure to take note of any possible areas of wound tracking, tunneling and packing appropriately. Wound can be packed loosely.  - cover wound with a dry ABD pad and secure with underware.  - change dressing as needed if leakage occurs, wound gets contaminated, or patient requests to shower. - patient may shower daily with wound open and following the shower the wound should be dried and a clean dressing placed. - As the wound heals it will require less packing. You may not need to pack the wound after 5-7 days.   Take a sitz bath 2 times a day preferably at time of dressing changes. Soak for 15-20 minutes each time. Perform a sitz bath after each bowel movement.    How to Take a Sitz Bath A sitz bath is a warm water bath that is taken while you are sitting down. The water should only come up to your hips and should cover your buttocks. Your health care provider may recommend a sitz bath to help you:  Clean the lower part of your body, including your genital area.  With itching.  With pain.  With sore muscles or muscles that tighten or spasm.  How to take a sitz bath Take 3-4 sitz baths per day or as told by your health care provider. 1. Partially fill a bathtub with warm water. You will only need the water to be deep  enough to cover your hips and buttocks when you are sitting in it. 2. If your health care provider told you to put medicine in the water, follow the directions exactly. 3. Sit in the water and open the tub drain a little. 4. Turn on the warm water again to keep the tub at the correct level. Keep the water running constantly. 5. Soak in the water for 15-20 minutes or as told by your health care provider. 6. After the sitz bath, pat the affected area dry first. Do not rub it. 7. Be careful when you stand up after the sitz bath because you may feel dizzy.  Contact a health care provider if:  Your symptoms get worse. Do not continue with sitz baths if your symptoms get worse.  You have new symptoms. Do not continue with sitz baths until you talk with your health care provider. This information is not intended to replace advice given to you by your health care provider. Make sure you discuss any questions you have with your health care provider. Document Released: 07/14/2004 Document Revised: 03/21/2016 Document Reviewed: 10/20/2014 Elsevier Interactive Patient Education  [employer].     1. PAIN CONTROL:  1. A prescription for pain medication (such as oxycodone, hydrocodone, etc) should be given to you upon discharge. Take your pain medication as prescribed.  1. If you are having problems/concerns with the prescription medicine (does not control  pain, nausea, vomiting, rash, itching, etc), please call us [phone] to see if we need to switch you to a different pain medicine that will work better for you and/or control your side effect better. 2. If you need a refill on your pain medication, please contact your pharmacy. They will contact our office to request authorization. Prescriptions will not be filled after 5 pm or on week-ends. 4. Avoid getting constipated. When taking pain medications, it is common to experience some constipation. Increasing fluid intake and taking a fiber  supplement (such as Metamucil, Citrucel, FiberCon, MiraLax, etc) 1-2 times a day regularly will usually help prevent this problem from occurring. A mild laxative (prune juice, Milk of Magnesia, MiraLax, etc) should be taken according to package directions if there are no bowel movements after 48 hours.  5. Watch out for diarrhea. If you have many loose bowel movements, simplify your diet to bland foods & liquids for a few days. Stop any stool softeners and decrease your fiber supplement. Switching to mild anti-diarrheal medications (Kayopectate, Pepto Bismol) can help. If this worsens or does not improve, please call us. 6. Wash / shower every day. You may shower daily and replace your bandges after showering. No bathing until wounds heal. 7. FOLLOW UP in our office  1. Please call [facility] at [phone] to set up an appointment for a follow-up appointment approximately 1-2 weeks after discharge for wound check  WHEN TO CALL us [phone]:  1. Poor pain control 2. Reactions / problems with new medications (rash/itching, nausea, etc)  3. Fever over 101.5 F (38.5 C) 4. Continued bleeding from wounds. 5. Increased pain, redness, or drainage from the wounds which could be signs of infection  The clinic staff is available to answer your questions during regular business hours (8:30am-5pm). Please dont hesitate to call and ask to speak to one of our nurses for clinical concerns.  If you have a medical emergency, go to the nearest emergency room or call 911.  A surgeon from [facility] is always on call at the [facility], [place], [place], [place], [place] [address] ?  MAIN: [phone] ? TOLL FREE: [phone] ?  FAX [phone]  [email or web address]

## 2018-02-10 ENCOUNTER — Encounter (HOSPITAL_COMMUNITY): Payer: Self-pay | Admitting: Emergency Medicine

## 2018-02-10 ENCOUNTER — Emergency Department (HOSPITAL_COMMUNITY)
Admission: EM | Admit: 2018-02-10 | Discharge: 2018-02-10 | Disposition: A | Discharge status: Home / Self Care | Payer: BLUE CROSS/BLUE SHIELD | Attending: Emergency Medicine | Admitting: Emergency Medicine

## 2018-02-10 ENCOUNTER — Other Ambulatory Visit: Payer: Self-pay

## 2018-02-10 DIAGNOSIS — B356 Tinea cruris: Secondary | ICD-10-CM | POA: Diagnosis not present

## 2018-02-10 DIAGNOSIS — L02214 Cutaneous abscess of groin: Secondary | ICD-10-CM | POA: Diagnosis not present

## 2018-02-10 DIAGNOSIS — Z79899 Other long term (current) drug therapy: Secondary | ICD-10-CM | POA: Diagnosis not present

## 2018-02-10 DIAGNOSIS — R1030 Lower abdominal pain, unspecified: Secondary | ICD-10-CM | POA: Diagnosis present

## 2018-02-10 HISTORY — DX: Other specified noninflammatory disorders of vulva and perineum: N90.89

## 2018-02-10 MED ORDER — KETOCONAZOLE 2 % EX CREA: 1.0000 "application " | TOPICAL_CREAM | Freq: Every day | CUTANEOUS | 0 refills | Status: DC

## 2018-02-10 MED ORDER — DOXYCYCLINE HYCLATE 100 MG PO CAPS: 100.0000 mg | ORAL_CAPSULE | Freq: Two times a day (BID) | ORAL | 0 refills | Status: DC

## 2018-02-10 MED ORDER — FENTANYL CITRATE (PF) 100 MCG/2ML IJ SOLN
50.0000 ug | Freq: Once | INTRAMUSCULAR | Status: AC
  Administered 2018-02-10: 50 ug via INTRAVENOUS
  Filled 2018-02-10: qty 2

## 2018-02-10 MED ORDER — HYDROCODONE-ACETAMINOPHEN 5-325 MG PO TABS: 1.0000 | ORAL_TABLET | Freq: Four times a day (QID) | ORAL | 0 refills | Status: DC | PRN

## 2018-02-10 MED ORDER — ONDANSETRON HCL 4 MG/2ML IJ SOLN
4.0000 mg | Freq: Once | INTRAMUSCULAR | Status: AC
  Administered 2018-02-10: 4 mg via INTRAVENOUS
  Filled 2018-02-10: qty 2

## 2018-02-10 NOTE — Discharge Instructions (Signed)
After your infections have improved, begin applying the Ketoconazole cream to your groin and vulva daily. Make sure to wear cotton underwear and keep the region as dry as possible. Follow up with dermatology in the future and follow up with the surgeons if your symptoms are not improving with antibiotics. Get help right away if: You have severe pain. You see red streaks on your skin spreading away from the abscess.

## 2018-02-10 NOTE — ED Notes (Signed)
ED Provider at bedside. 

## 2018-02-10 NOTE — ED Triage Notes (Addendum)
Pt stated that she has multiple cysts on r/and l/thigh, also in vaginal area x 2 weeks. Small amount yellow discharger reported on cyst on l/thigh.. Hx of similar cysts. Using 20% benzocaine cream for treatment

## 2018-02-10 NOTE — ED Provider Notes (Signed)
[facility] DEPT Provider Note   CSN: [id number] Arrival date & time: 02/10/18  1001     History   Chief Complaint Chief Complaint  Patient presents with  . Cyst    HPI LESSLIE Bell is a 47 y.o. female who presents for evaluation of inguinal/ vulvar abscess. She has had these all of her post-pubescent. The patient states that for the past 2 weeks she has had worsening pain and swelling in the R inguina. She c/o severe pain and denies fever, chills. She has multiple painful cysts over the vulva and one in the left inguina that is actively draining purulence. The patient has constantly macerated tissue in her groin. She has a family hx (mother and brothers) of abscess formation. She states that she will not consent to bedside I&D.  HPI  Past Medical History:  Diagnosis Date  . 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 Fibroids 2009    Patient Active Problem List   Diagnosis Date Noted  . Perirectal abscess 06/03/2017  . Fibroids 02/24/2013    Past Surgical History:  Procedure Laterality Date  . Bilateral repair of hammertoe     4th toe of both feet  . DILATION AND EVACUATION  04/18/2015   Procedure: DILATATION AND EVACUATION;  Surgeon: Alden Hipp, MD;  Location: [facility];  Service: Gynecology;;  . HYSTEROSCOPY WITH NOVASURE N/A 04/18/2015   Procedure: HYSTEROSCOPY WITH RESECTION OF ENDOMETRIAL POLYP using resectoscope, suction dilatation and curratage;  Surgeon: Alden Hipp, MD;  Location: [facility];  Service: Gynecology;  Laterality: N/A;  RESECTOSCOPE AVAILABLE  . INCISION AND DRAINAGE PERIRECTAL ABSCESS N/A 06/04/2017   Procedure: IRRIGATION AND DEBRIDEMENT RIGHT PERIRECTAL ABSCESS AND LEFT LABIAL ABCESS;  Surgeon: Armandina Gemma, MD;  Location: [facility];  Service: General;  Laterality: N/A;  . polyknital   1990   polyknital cyst excision   . UTERINE FIBROID EMBOLIZATION  02/23/2013     OB History   None      Home Medications    Prior  to Admission medications   Medication Sig Start Date End Date Taking? Authorizing Provider  Benzocaine (BOIL-EASE EX) Apply 1 application topically 2 (two) times daily as needed (boil/pain).   Yes [provider]  norgestrel-ethinyl estradiol (CRYSELLE-28) 0.3-30 MG-MCG tablet Take 1 tablet by mouth daily.   Yes [provider]  doxycycline (VIBRAMYCIN) 100 MG capsule Take 1 capsule (100 mg total) by mouth 2 (two) times daily. 02/10/18   Margarita Mail, PA-C  HYDROcodone-acetaminophen (NORCO) 5-325 MG tablet Take 1-2 tablets by mouth every 6 (six) hours as needed for moderate pain or severe pain. 02/10/18   Margarita Mail, PA-C  ketoconazole (NIZORAL) 2 % cream Apply 1 application topically daily. 02/10/18   Margarita Mail, PA-C    Family History Family History  Problem Relation Age of Onset  . Cancer Mother   . Hypertension Mother   . Hypertension Father     Social History Social History   Tobacco Use  . Smoking status: Never Smoker  . Smokeless tobacco: Never Used  Substance Use Topics  . Alcohol use: Yes    Comment: Occasional glass of wine  . Drug use: No     Allergies   Patient has no known allergies.   Review of Systems Review of Systems  Ten systems reviewed and are negative for acute change, except as noted in the HPI.   Physical Exam Updated Vital Signs BP 121/86 (BP Location: Right Arm)   Pulse 69  Temp 98.7 F (37.1 C) (Oral)   Resp 16   Wt 88.5 kg (195 lb)   LMP 02/04/2018 (Exact Date)   SpO2 100%   BMI 28.80 kg/m   Physical Exam  Constitutional: She is oriented to person, place, and time. She appears well-developed and well-nourished. No distress.  HENT:  Head: Normocephalic and atraumatic.  Eyes: Conjunctivae are normal. No scleral icterus.  Neck: Normal range of motion.  Cardiovascular: Normal rate, regular rhythm and normal heart sounds. Exam reveals no gallop and no friction rub.  No murmur heard. Pulmonary/Chest: Effort  normal and breath sounds normal. No respiratory distress.  Abdominal: Soft. Bowel sounds are normal. She exhibits no distension and no mass. There is no tenderness. There is no guarding.  Genitourinary:  Genitourinary Comments: Multiple small tender fluctuant and indurated abscess of the vulva. Large R inguinal abscess. Left pubic abscess actively draining purulence  Neurological: She is alert and oriented to person, place, and time.  Skin: Skin is warm and dry. She is not diaphoretic.  Psychiatric: Her behavior is normal.  Nursing note and vitals reviewed.    ED Treatments / Results  Labs (all labs ordered are listed, but only abnormal results are displayed) Labs Reviewed - No data to display  EKG None  Radiology No results found.  Procedures Procedures (including critical care time)  Medications Ordered in ED Medications  fentaNYL (SUBLIMAZE) injection 50 mcg (50 mcg Intravenous Given 02/10/18 1443)  ondansetron (ZOFRAN) injection 4 mg (4 mg Intravenous Given 02/10/18 1443)     Initial Impression / Assessment and Plan / ED Course  I have reviewed the triage vital signs and the nursing notes.  Pertinent labs & imaging results that were available during my care of the patient were reviewed by me and considered in my medical decision making (see chart for details).     Patient afebrile and HDS. Seen in the ER by Dr. Kieth Brightly of [place]. The patient will be discharged home with doxycycline, and Norco.  Patient also given ketoconazole 2% for underlying tinea cruris.  Patient appears appropriate for discharge at this time.  She has been given followed up with surgery and dermatology.  I discussed return precautions  Final Clinical Impressions(s) / ED Diagnoses   Final diagnoses:  Inguinal abscess  Tinea cruris    ED Discharge Orders        Ordered    doxycycline (VIBRAMYCIN) 100 MG capsule  2 times daily     02/10/18 1530    HYDROcodone-acetaminophen (NORCO) 5-325 MG tablet   Every 6 hours PRN     02/10/18 1530    ketoconazole (NIZORAL) 2 % cream  Daily     02/10/18 1535       Margarita Mail, PA-C 02/10/18 1543    Dorie Rank, MD 02/11/18 [phone]

## 2018-02-10 NOTE — Consult Note (Addendum)
Reason for Consult:  2 week history of multiple cysts right and left thigh and vaginal area Referring Physician:  Margarita Mail [facility] *Patient, No Pcp Per    Sharon Bell is an 47 y.o. female  HPI: Patient is a 47 year old female who we saw last year 06/03/17 with multiple abscesses solid been drained in the emergency department.  She was found to have a pilonidal cyst and was ultimately taken to the operating room by Dr. Harlow Asa, and underwent irrigation debridement of right perirectal abscess in the left labial abscess.  She returns to the emergency department today with a 2-week history of multiple cysts right and left thigh and the vaginal area.  She has been treating this with benzocaine at home.  She reports the swelling has become progressively worse, no fever or chills.  At least one site is actively draining in the left inguinal area.  She said she would not consent to incision and drainage in the ED and we were asked to see.  Workup in the ED shows she is afebrile and vital signs are stable.  There are no labs or x-rays pending.  Past Medical History:  Diagnosis Date  . 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 Fibroids 2009    Past Surgical History:  Procedure Laterality Date  . Bilateral repair of hammertoe     4th toe of both feet  . DILATION AND EVACUATION  04/18/2015   Procedure: DILATATION AND EVACUATION;  Surgeon: Alden Hipp, MD;  Location: [facility];  Service: Gynecology;;  . HYSTEROSCOPY WITH NOVASURE N/A 04/18/2015   Procedure: HYSTEROSCOPY WITH RESECTION OF ENDOMETRIAL POLYP using resectoscope, suction dilatation and curratage;  Surgeon: Alden Hipp, MD;  Location: [facility];  Service: Gynecology;  Laterality: N/A;  RESECTOSCOPE AVAILABLE  . INCISION AND DRAINAGE PERIRECTAL ABSCESS N/A 06/04/2017   Procedure: IRRIGATION AND DEBRIDEMENT RIGHT PERIRECTAL ABSCESS AND LEFT LABIAL ABCESS;  Surgeon: Armandina Gemma, MD;  Location: [facility];  Service: General;  Laterality: N/A;  .  polyknital   1990   polyknital cyst excision   . UTERINE FIBROID EMBOLIZATION  02/23/2013    Family History  Problem Relation Age of Onset  . Cancer Mother   . Hypertension Mother   . Hypertension Father     Social History:  reports that she has never smoked. She has never used smokeless tobacco. She reports that she drinks alcohol. She reports that she does not use drugs.  Allergies: No Known Allergies  Prior to Admission medications   Medication Sig Start Date End Date Taking? Authorizing Provider  Benzocaine (BOIL-EASE EX) Apply 1 application topically 2 (two) times daily as needed (boil/pain).   Yes [provider]  norgestrel-ethinyl estradiol (CRYSELLE-28) 0.3-30 MG-MCG tablet Take 1 tablet by mouth daily.   Yes [provider]  HYDROcodone-acetaminophen (NORCO/VICODIN) 5-325 MG tablet Take 1-2 tablets by mouth every 4 (four) hours as needed for moderate pain (before dressing changes). Patient not taking: Reported on 02/10/2018 06/05/17   Kalman Drape, PA     No results found for this or any previous visit (from the past 12 hour(s)).  No results found.  Review of Systems  Constitutional: Negative.   HENT: Negative.   Eyes: Negative.   Respiratory: Negative.   Cardiovascular: Negative.   Gastrointestinal: Positive for constipation. Negative for abdominal pain, blood in stool, diarrhea, heartburn, melena, nausea and vomiting.  Genitourinary: Negative.   Musculoskeletal: Negative.   Skin: Negative.   Neurological: Negative.   Endo/Heme/Allergies: Negative.  Psychiatric/Behavioral: Negative.    Blood pressure 121/86, pulse 69, temperature 98.7 F (37.1 C), temperature source Oral, resp. rate 16, weight 88.5 kg (195 lb), last menstrual period 02/04/2018, SpO2 100 %. Physical Exam  Constitutional: She appears well-developed and well-nourished. No distress.  HENT:  Head: Normocephalic and atraumatic.  Mouth/Throat: No oropharyngeal exudate.  Eyes:  Right eye exhibits no discharge. Left eye exhibits no discharge. No scleral icterus.  Pupils are equal  Neck: Normal range of motion. Neck supple. No JVD present. No tracheal deviation present. No thyromegaly present.  Cardiovascular: Normal rate, regular rhythm, normal heart sounds and intact distal pulses.  No murmur heard. Respiratory: Effort normal and breath sounds normal. No respiratory distress. She has no wheezes. She has no rales. She exhibits no tenderness.  GI: Soft. Bowel sounds are normal. She exhibits no distension and no mass. There is no tenderness. There is no rebound and no guarding.  Genitourinary:  Genitourinary Comments: She has several small cyst, one in the left upper inguinal area that is draining and several between her thighs and vagina on both sides.  The area is macerated from the drainage.  No one drain able site is found.  She does have one boggy area on the right thigh that does not have cellulitis but is questionable.  The cyst are all 2-3 mm in diameter.    Lymphadenopathy:    She has no cervical adenopathy.  Skin: She is not diaphoretic.    Assessment/Plan: Recurrent bilateral groin, inguinal and labial abscesses Hx of Fibroids  Plan:  Dr. Kieth Brightly has evaluated the patient.  At this point there is nothing to drain, and drainage from the open cyst is causing maceration of the skin between the thighs, and vaginal areas bilaterally.  Dr. Kieth Brightly thinks she should be placed on antibiotics with follow up with PCP to be sure there are no underlying issues. She can then follow up with Dr. Harlow Asa is these continue to be an issue, it is almost like a hidradenitis.  She ask about Dermatology follow up and we recommended she complete the antibiotics, and get the site cleaned up, see a PCP, and then ask about follow up consults.  Continue to clean with plain soap and water.  Keep sites as dry and clean as possible.  Habib Kise 02/10/2018, 2:44 PM

## 2018-02-26 ENCOUNTER — Other Ambulatory Visit: Payer: Self-pay | Admitting: Obstetrics & Gynecology

## 2018-02-26 DIAGNOSIS — R928 Other abnormal and inconclusive findings on diagnostic imaging of breast: Secondary | ICD-10-CM

## 2018-02-28 ENCOUNTER — Ambulatory Visit
Admission: RE | Admit: 2018-02-28 | Discharge: 2018-02-28 | Disposition: A | Discharge status: Home / Self Care | Payer: BLUE CROSS/BLUE SHIELD | Source: Ambulatory Visit | Attending: Obstetrics & Gynecology | Admitting: Obstetrics & Gynecology

## 2018-02-28 DIAGNOSIS — R928 Other abnormal and inconclusive findings on diagnostic imaging of breast: Secondary | ICD-10-CM

## 2019-01-02 IMAGING — US ULTRASOUND LEFT BREAST LIMITED
1 series · 6 of 6 positions shown · non-contrast
Comparison: Previous exam(s).

CLINICAL DATA: Possible mass in the medial left breast in the
craniocaudal projection of a recent screening mammogram.

EXAM:
DIGITAL DIAGNOSTIC LEFT MAMMOGRAM WITH CAD AND TOMO
ULTRASOUND LEFT BREAST

[Series 1: ultrasound left breast limited · 0.07mm/px · 6 of 6 slices shown]
[im 1/6]
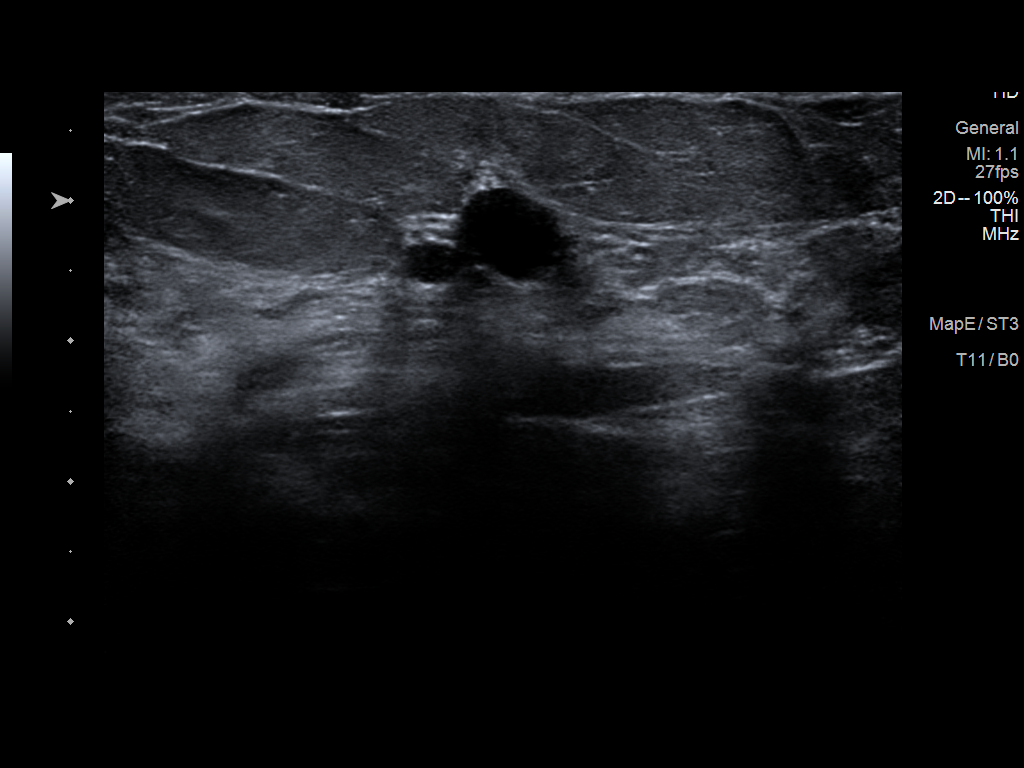
[im 2/6]
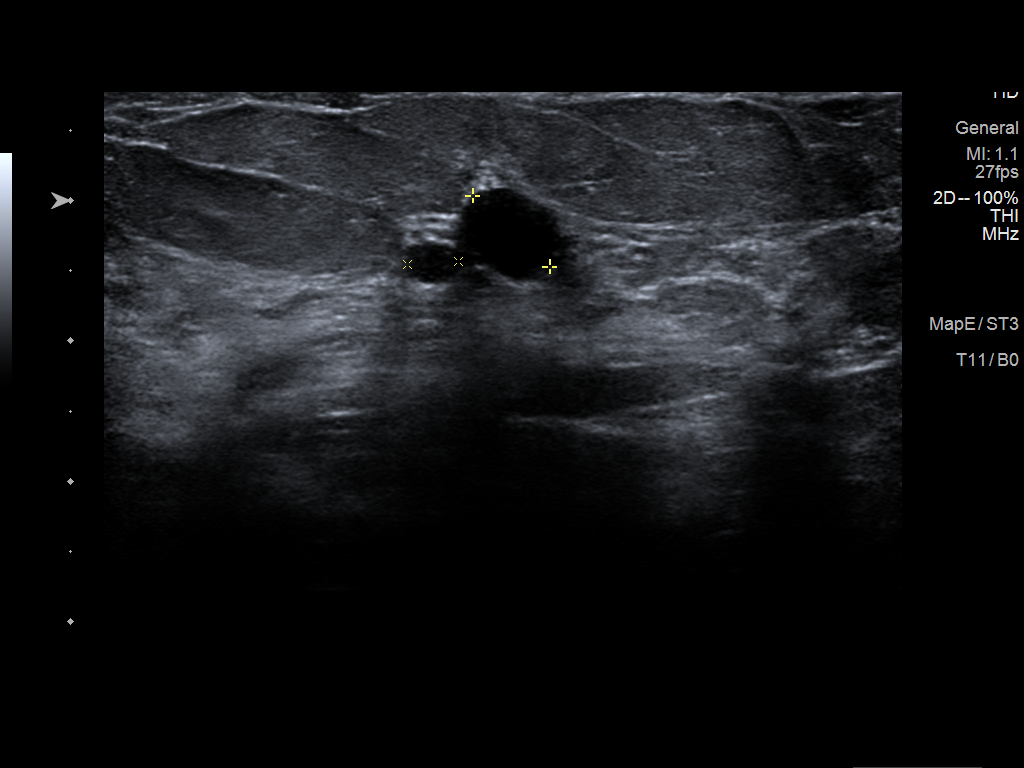
[im 3/6]
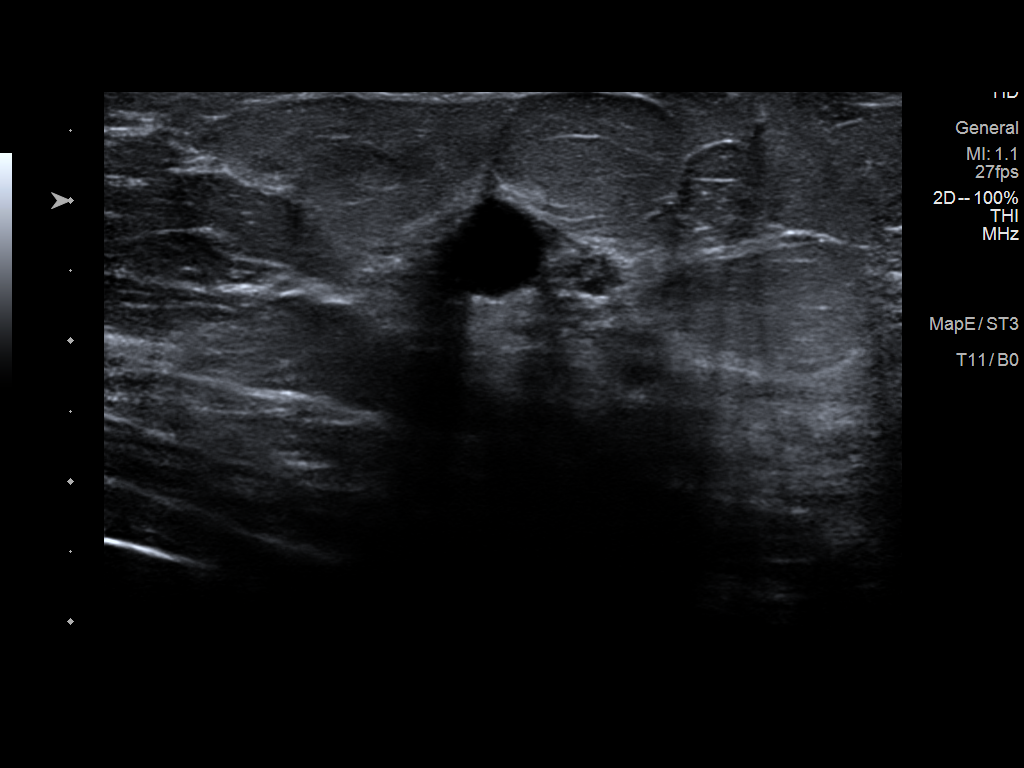
[im 4/6]
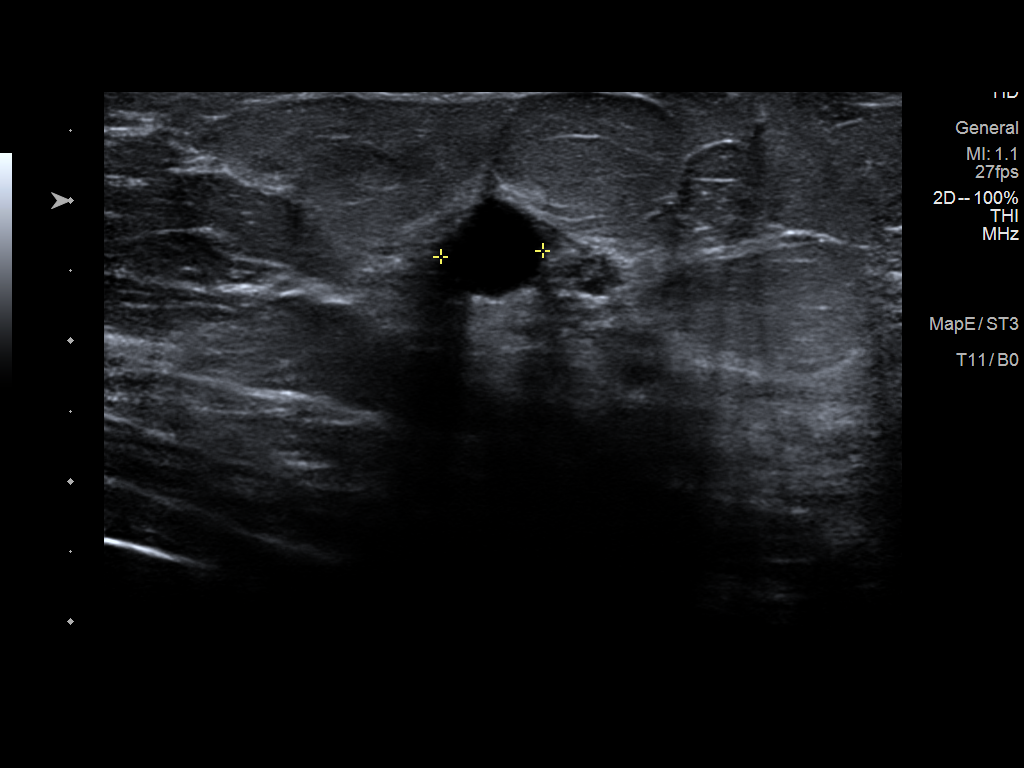
[im 5/6]
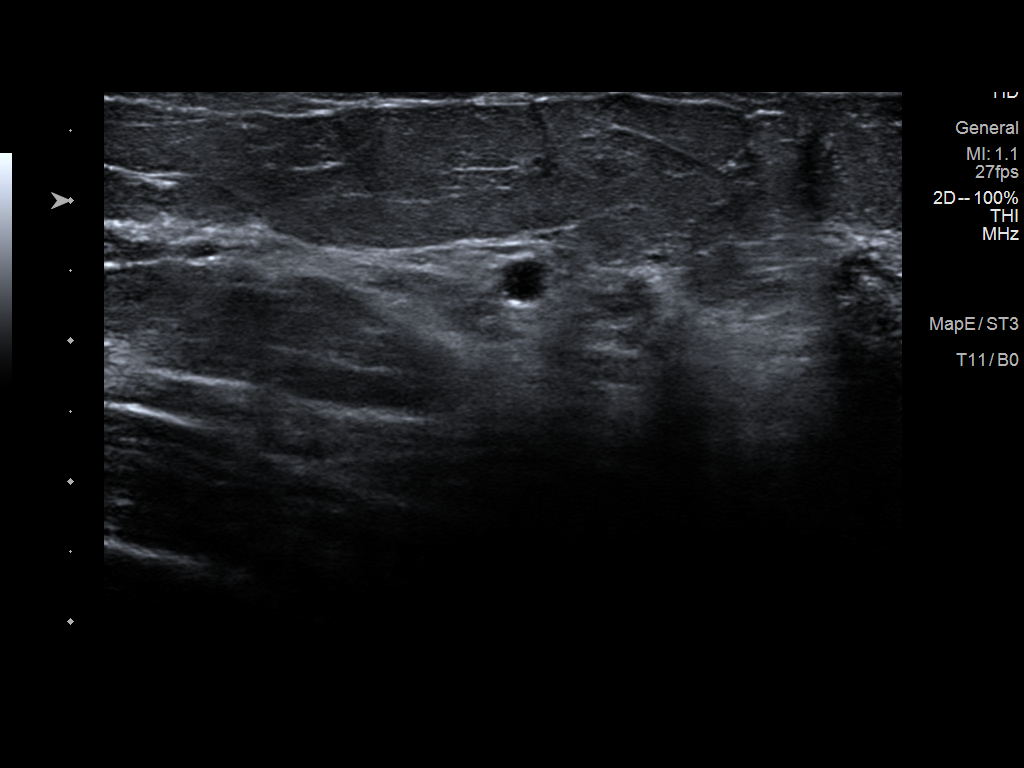
[im 6/6]
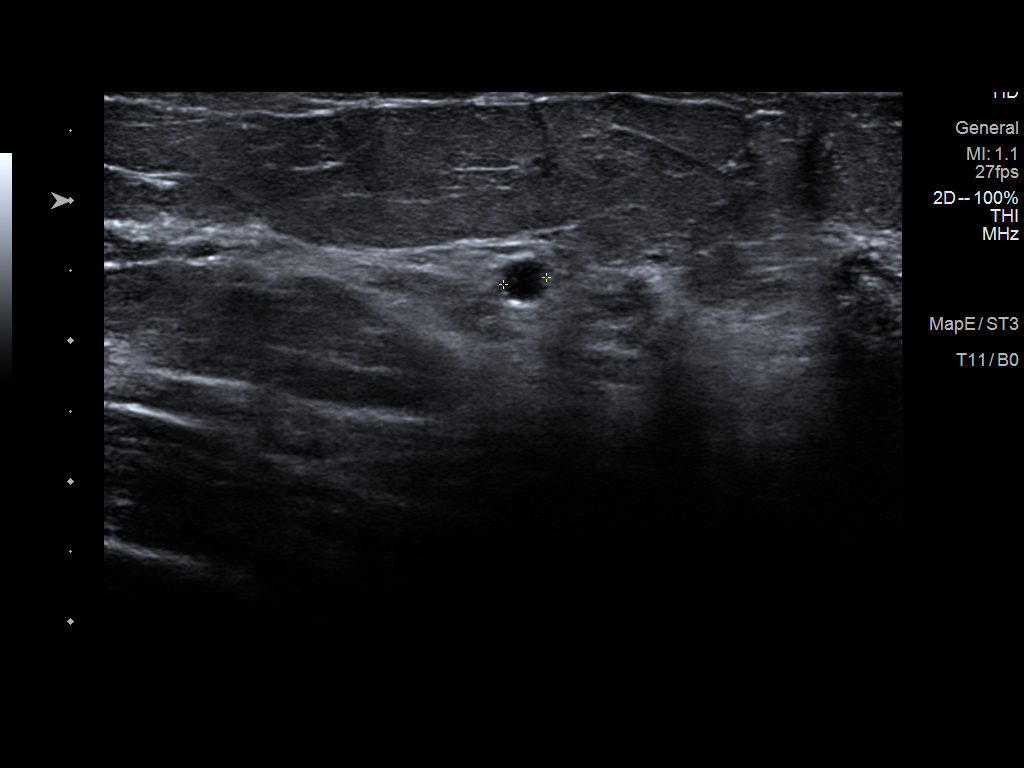

[6 of 6 positions shown; findings below may reference images not displayed]

ACR Breast Density Category c: The breast tissue is heterogeneously
dense, which may obscure small masses.
FINDINGS: 3D tomographic and 2D generated true lateral and spot compression
craniocaudal views of the left breast were obtained. The spot
compression craniocaudal views confirm adjacent 7 mm and 4 mm
rounded, circumscribed and partially obscured masses in the anterior
aspect of the breast medially. These are not seen in the true
lateral projection or recent oblique projection.

Mammographic images were processed with CAD.

On physical exam, no mass is palpable in the medial left breast.

Targeted ultrasound is performed, showing adjacent 8 mm and 4 mm
cysts in the 9 o'clock position of the left breast, 1 cm from the
nipple. These correspond the mammographic masses.
IMPRESSION: Benign left breast cysts.  No evidence of malignancy.

RECOMMENDATION:
Bilateral screening mammogram in 1 year.

I have discussed the findings and recommendations with the patient.
Results were also provided in writing at the conclusion of the
visit. If applicable, a reminder letter will be sent to the patient
regarding the next appointment.

BI-RADS CATEGORY  2: Benign.

## 2019-01-02 IMAGING — MG DIGITAL DIAGNOSTIC UNILATERAL LEFT MAMMOGRAM WITH TOMO AND CAD
4 series · 4 of 12 positions shown · non-contrast
Comparison: Previous exam(s).

CLINICAL DATA: Possible mass in the medial left breast in the
craniocaudal projection of a recent screening mammogram.

EXAM:
DIGITAL DIAGNOSTIC LEFT MAMMOGRAM WITH CAD AND TOMO
ULTRASOUND LEFT BREAST

[L CC synth-2D]
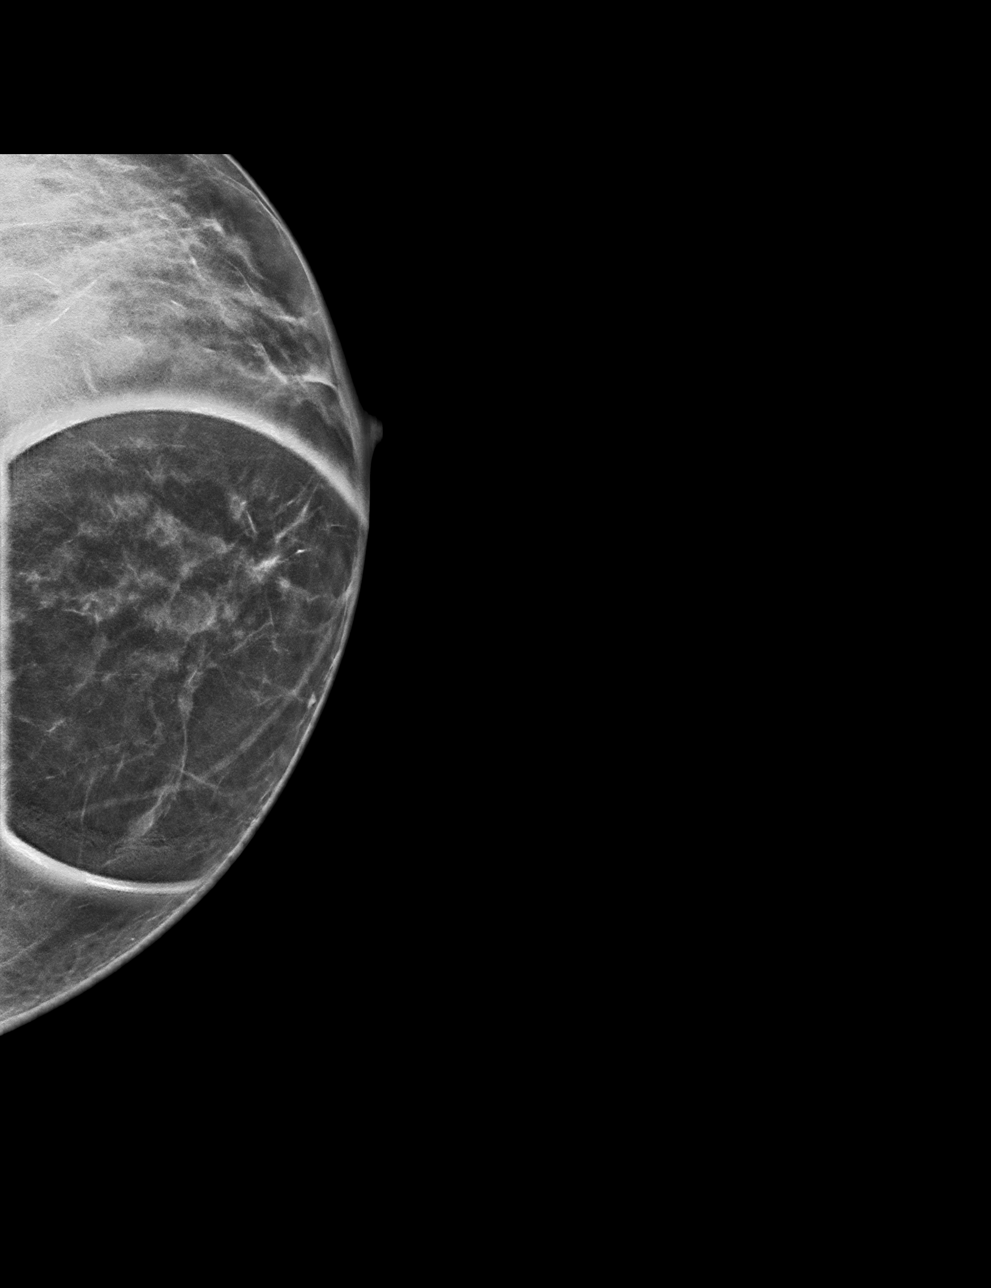

[L ML synth-2D]
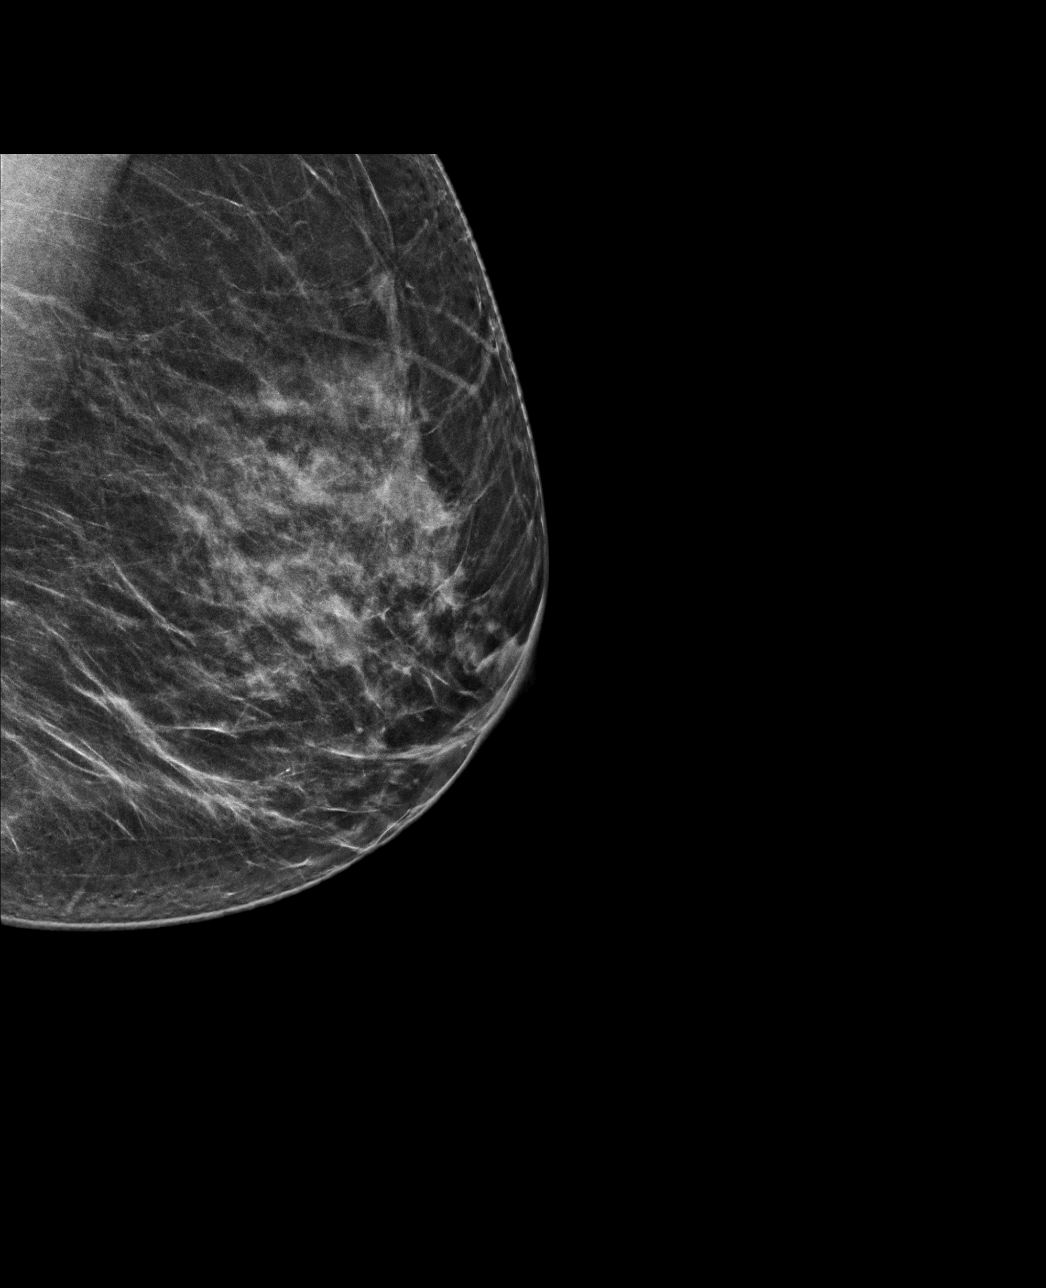

[L ML tomo · tomo slice 34/67.0]
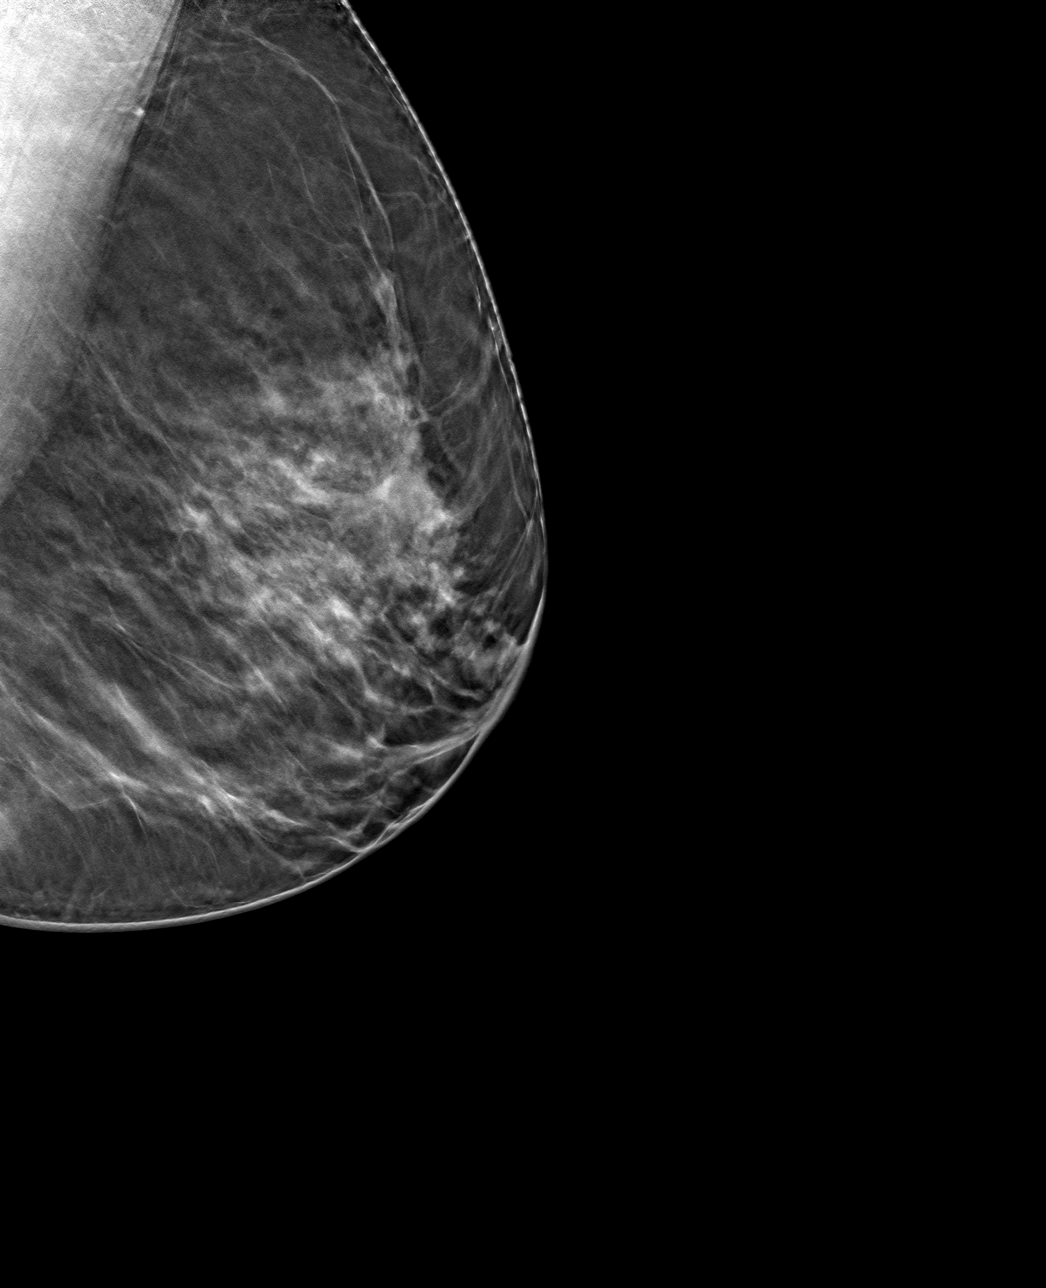

[L CC tomo · tomo slice 29/56.0]
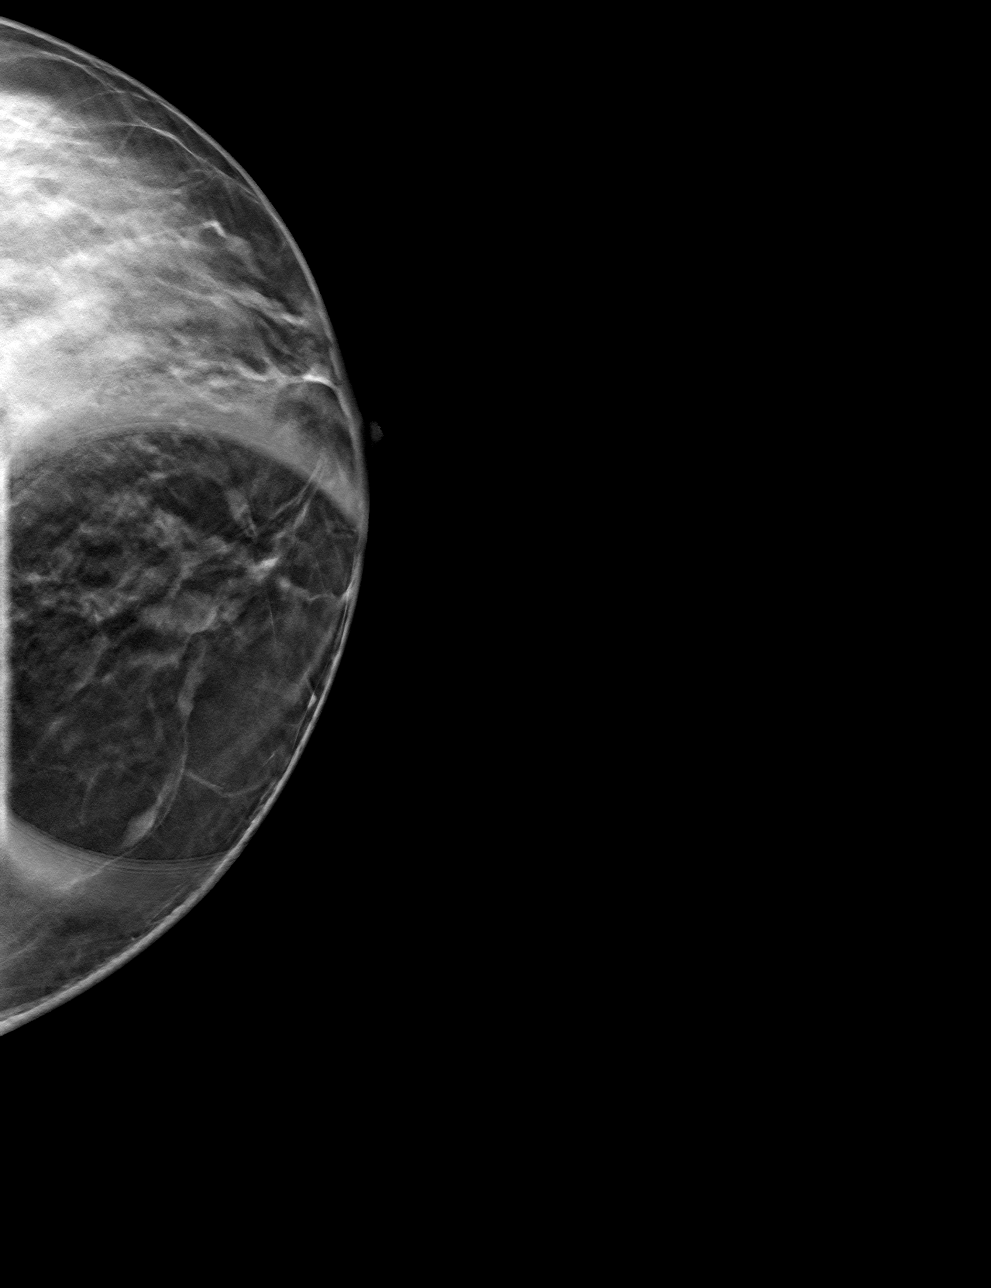

[4 of 12 positions shown; findings below may reference images not displayed]

ACR Breast Density Category c: The breast tissue is heterogeneously
dense, which may obscure small masses.
FINDINGS: 3D tomographic and 2D generated true lateral and spot compression
craniocaudal views of the left breast were obtained. The spot
compression craniocaudal views confirm adjacent 7 mm and 4 mm
rounded, circumscribed and partially obscured masses in the anterior
aspect of the breast medially. These are not seen in the true
lateral projection or recent oblique projection.

Mammographic images were processed with CAD.

On physical exam, no mass is palpable in the medial left breast.

Targeted ultrasound is performed, showing adjacent 8 mm and 4 mm
cysts in the 9 o'clock position of the left breast, 1 cm from the
nipple. These correspond the mammographic masses.
IMPRESSION: Benign left breast cysts.  No evidence of malignancy.

RECOMMENDATION:
Bilateral screening mammogram in 1 year.

I have discussed the findings and recommendations with the patient.
Results were also provided in writing at the conclusion of the
visit. If applicable, a reminder letter will be sent to the patient
regarding the next appointment.

BI-RADS CATEGORY  2: Benign.

## 2020-03-24 ENCOUNTER — Ambulatory Visit (HOSPITAL_COMMUNITY)
Admission: EM | Admit: 2020-03-24 | Discharge: 2020-03-24 | Disposition: A | Discharge status: Home / Self Care | Payer: BC Managed Care – PPO | Attending: Family Medicine | Admitting: Family Medicine

## 2020-03-24 ENCOUNTER — Telehealth (HOSPITAL_COMMUNITY): Payer: Self-pay | Admitting: Family Medicine

## 2020-03-24 ENCOUNTER — Encounter (HOSPITAL_COMMUNITY): Payer: Self-pay

## 2020-03-24 ENCOUNTER — Other Ambulatory Visit: Payer: Self-pay

## 2020-03-24 DIAGNOSIS — Z20822 Contact with and (suspected) exposure to covid-19: Secondary | ICD-10-CM

## 2020-03-24 NOTE — ED Triage Notes (Signed)
Pt is here after a COVID exposure, her husband came home yesterday after testing POSITIVE for COVID, pt wants COVID testing today, pt is denying ALL s/s.

## 2020-03-24 NOTE — ED Provider Notes (Signed)
[place]    CSN: [id number] Arrival date & time: 03/24/20  1046      History   Chief Complaint Chief Complaint  Patient presents with  . Covid Exposure    HPI Sharon Bell [facility] is a 49 y.o. female.   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 Fibroids 2009    Patient Active Problem List   Diagnosis Date Noted  . Perirectal abscess 06/03/2017  . Fibroids 02/24/2013    Past Surgical History:  Procedure Laterality Date  . Bilateral repair of hammertoe     4th toe of both feet  . DILATION AND EVACUATION  04/18/2015   Procedure: DILATATION AND EVACUATION;  Surgeon: Alden Hipp, MD;  Location: [facility];  Service: Gynecology;;  . HYSTEROSCOPY WITH NOVASURE N/A 04/18/2015   Procedure: HYSTEROSCOPY WITH RESECTION OF ENDOMETRIAL POLYP using resectoscope, suction dilatation and curratage;  Surgeon: Alden Hipp, MD;  Location: [facility];  Service: Gynecology;  Laterality: N/A;  RESECTOSCOPE AVAILABLE  . INCISION AND DRAINAGE PERIRECTAL ABSCESS N/A 06/04/2017   Procedure: IRRIGATION AND DEBRIDEMENT RIGHT PERIRECTAL ABSCESS AND LEFT LABIAL ABCESS;  Surgeon: Armandina Gemma, MD;  Location: [facility];  Service: General;  Laterality: N/A;  . polyknital   1990   polyknital cyst excision   . UTERINE FIBROID EMBOLIZATION  02/23/2013    OB History   No obstetric history on file.      Home Medications    Prior to Admission medications   Medication Sig Start Date End Date Taking? Authorizing Provider  norgestrel-ethinyl estradiol (CRYSELLE-28) 0.3-30 MG-MCG tablet Take 1 tablet by mouth daily.    [provider]    Family History Family History  Problem Relation Age of Onset  . Cancer Mother   .  Hypertension Mother   . Hypertension Father     Social History Social History   Tobacco Use  . Smoking status: Never Smoker  . Smokeless tobacco: Never Used  Substance Use Topics  . Alcohol use: Yes    Comment: Occasional glass of wine  . Drug use: No     Allergies   Patient has no known allergies.   Review of Systems Review of Systems  Constitutional:       Denies viral symptoms     Physical Exam Triage Vital Signs ED Triage Vitals  Enc Vitals Group     BP 03/24/20 1143 132/85     Pulse Rate 03/24/20 1143 82     Resp 03/24/20 1143 18     Temp 03/24/20 1143 98.6 F (37 C)     Temp Source 03/24/20 1143 Oral     SpO2 03/24/20 1143 100 %     Weight 03/24/20 1141 213 lb (96.6 kg)     Height --      Head Circumference --      Peak Flow --      Pain Score 03/24/20 1141 0     Pain Loc --      Pain Edu? --      Excl. in [place]? --    No data found.  Updated Vital Signs BP 132/85 (BP Location: Right Arm)   Pulse 82   Temp 98.6 F (37  C) (Oral)   Resp 18   Wt 96.6 kg   SpO2 100%   BMI 31.45 kg/m   Visual Acuity Right Eye Distance:   Left Eye Distance:   Bilateral Distance:    Right Eye Near:   Left Eye Near:    Bilateral Near:     Physical Exam Constitutional:      General: She is not in acute distress.    Appearance: She is well-developed.     Comments: Appears well.  Exam by observation.  Mask is in place  HENT:     Head: Normocephalic and atraumatic.  Eyes:     Conjunctiva/sclera: Conjunctivae normal.     Pupils: Pupils are equal, round, and reactive to light.  Cardiovascular:     Rate and Rhythm: Normal rate.  Pulmonary:     Effort: Pulmonary effort is normal. No respiratory distress.  Musculoskeletal:        General: Normal range of motion.     Cervical back: Normal range of motion.  Skin:    General: Skin is warm and dry.  Neurological:     Mental Status: She is alert.  Psychiatric:        Mood and Affect: Mood normal.         Behavior: Behavior normal.      UC Treatments / Results  Labs (all labs ordered are listed, but only abnormal results are displayed) Labs Reviewed - No data to display  EKG   Radiology No results found.  Procedures Procedures (including critical care time)  Medications Ordered in UC Medications - No data to display  Initial Impression / Assessment and Plan / UC Course  I have reviewed the triage vital signs and the nursing notes.  Pertinent labs & imaging results that were available during my care of the patient were reviewed by me and considered in my medical decision making (see chart for details).      Final Clinical Impressions(s) / UC Diagnoses   Final diagnoses:  Encounter for laboratory testing for COVID-19 virus     Discharge Instructions     Go home to rest Drink plenty of fluids Take Tylenol for pain or fever You may take over-the-counter cough and cold medicines as needed You must quarantine at home until your test result is available You can check for your test result in MyChart    ED Prescriptions    None     PDMP not reviewed this encounter.   Raylene Everts, MD 03/24/20 [phone]

## 2020-03-24 NOTE — Telephone Encounter (Signed)
Covid test was obtained at the visit. Order was not placed for greater than 2 hours Attempted to place order for you a telephone note Notified Tiffany in the laboratory that this test is pending.  She offered to call the laboratory to get it ordered

## 2020-03-24 NOTE — Discharge Instructions (Signed)
Go home to rest Drink plenty of fluids Take Tylenol for pain or fever You may take over-the-counter cough and cold medicines as needed You must quarantine at home until your test result is available You can check for your test result in MyChart  

## 2020-03-25 LAB — SARS CORONAVIRUS 2 (TAT 6-24 HRS): SARS Coronavirus 2: NEGATIVE
